# Patient Record
Sex: Female | Born: 1985 | Race: White | Hispanic: No | Marital: Single | Smoking: Never smoker
Health system: Southern US, Community
[De-identification: ages and names within clinical notes are randomized; demographics above are authoritative.]

## PROBLEM LIST (undated history)

## (undated) DIAGNOSIS — F419 Anxiety disorder, unspecified: Secondary | ICD-10-CM

## (undated) DIAGNOSIS — F329 Major depressive disorder, single episode, unspecified: Secondary | ICD-10-CM

## (undated) DIAGNOSIS — F32A Depression, unspecified: Secondary | ICD-10-CM

## (undated) HISTORY — PX: MOUTH SURGERY: SHX715

## (undated) HISTORY — PX: FRACTURE SURGERY: SHX138

---
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
## 2009-03-05 ENCOUNTER — Encounter: Payer: Self-pay | Admitting: Internal Medicine

## 2009-04-04 ENCOUNTER — Ambulatory Visit: Payer: Self-pay | Admitting: Internal Medicine

## 2009-04-04 DIAGNOSIS — J309 Allergic rhinitis, unspecified: Secondary | ICD-10-CM | POA: Insufficient documentation

## 2009-04-04 DIAGNOSIS — R062 Wheezing: Secondary | ICD-10-CM

## 2009-04-04 DIAGNOSIS — K219 Gastro-esophageal reflux disease without esophagitis: Secondary | ICD-10-CM

## 2009-05-22 ENCOUNTER — Ambulatory Visit (HOSPITAL_COMMUNITY): Admission: RE | Admit: 2009-05-22 | Discharge: 2009-05-22 | Payer: Self-pay | Admitting: Internal Medicine

## 2009-05-22 ENCOUNTER — Encounter: Payer: Self-pay | Admitting: Internal Medicine

## 2009-05-29 ENCOUNTER — Ambulatory Visit: Payer: Self-pay | Admitting: Internal Medicine

## 2010-02-12 NOTE — Assessment & Plan Note (Signed)
Summary: CHRONIC ASTHMA SYMPTOMS/APC   Visit Type:  Initial Consult Copy to:  Dr. Reola Calkins Primary Provider/Referring Provider:  Dr. Lillia Pauls student health  CC:  Pulmonary consult for wheezing and SOB with activity..  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 Reports dyspnea and wheezing. Insidious onset one year ago following a cold/URI. REcollects having rash with mucinex and since then current symptoms. Started noticing 'heavy breathing' and wheezing during and after exercise (like jogging or treadmill). Symptoms have been relieved significantly (but not completely) by flovent and pro-air. These inhalers were started around July 2010 by her PMD. Symptoms were worsened during a brief interval when she came off inhalers. She states PMD diagnosed her with asthma but subsequent spirometries(July 2010 and Feb 2011) showed restrictive pattern. Therefore, PMD concerned if she genuinely has asthma or not. Question of GERD by PMD as well per his notes.   She does suffere associated  from GERD (familial). PResent for few years. Controlled with dietary measures and as needed. With dietary indiscretion symptoms can be severe but currently symptoms are mild.   Also reports 70# weight gain since 2007 due to lifestyle changes.  Denies associated cough, sputum, hemoptysis, fever, chills, nausea, vomitting, diarrhea, pregnancy, loc, diplopia, rash, edema, cyanosis.   Current Medications (verified): 1)  Flovent Hfa 44 Mcg/act Aero (Fluticasone Propionate  Hfa) .... 2 Puffs Twice Daily 2)  Proair Hfa 108 (90 Base) Mcg/act Aers (Albuterol Sulfate) .... 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 Has been to [place] and [place] 2007, [place]  2005  Past Medical History: #Allergic Rhinitis > STarted in 2008. Nasal and eyes.. 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 Has been to [place] and [place] 2007, [place]  2005  Review of Systems       The patient complains of shortness of breath with activity.  The patient denies shortness of breath at rest, productive cough, non-productive cough, coughing up blood, chest pain, irregular heartbeats, acid heartburn, indigestion, loss of appetite, weight change, abdominal pain, difficulty swallowing, sore throat, tooth/dental problems, headaches, nasal congestion/difficulty breathing through nose, sneezing, itching, ear ache, anxiety, depression, hand/feet swelling, joint stiffness or pain, rash, change in color of mucus, and fever.    Vital Signs:  Patient profile:   25 year old female Height:      66 inches Weight:      212.44 pounds BMI:     34.41 O2 Sat:      98 % on Room air Temp:     97.8 degrees F oral Pulse rate:   92 / minute BP sitting:   122 / 80  (right arm) Cuff size:   regular  Vitals Entered By: Michel Bickers CMA (April 04, 2009 9:11 AM)  O2 Flow:  Room air CC: Pulmonary consult for wheezing and SOB with activity. Comments Medications reviewed with  patient Carron Curie CMA  April 04, 2009 9:12 AM Daytime phone number verified with patient.       Physical Exam  General:  obese.   Head:  normocephalic and atraumatic Eyes:  PERRLA/EOM intact; conjunctiva and sclera clear Ears:  TMs  intact and clear with normal canals Nose:  no deformity, discharge, inflammation, or lesions Mouth:  no deformity or lesions Neck:  no masses, thyromegaly, or abnormal cervical nodes Chest Wall:  no deformities noted Lungs:  clear bilaterally to auscultation and percussion Heart:  regular rate and rhythm, S1, S2 without murmurs, rubs, gallops, or clicks Abdomen:  bowel sounds positive; abdomen soft and non-tender without masses, or organomegaly Msk:  no deformity or scoliosis noted with normal posture Pulses:  pulses normal Extremities:  no clubbing, cyanosis, edema, or deformity noted Neurologic:  CN II-XII grossly intact with normal reflexes, coordination, muscle strength and tone Skin:  intact without lesions or rashes Cervical Nodes:  no significant adenopathy Axillary Nodes:  no significant adenopathy Psych:  alert and cooperative; normal mood and affect; normal attention span and concentration   MISC. Report  Procedure date:  03/05/2009  Findings:      Outside spirometry: shows classic restriction. Good effort. Good looops. No evidence of VCD. Independently revieewed  Impression & Recommendations:  Problem # 1:  WHEEZING (ICD-786.07) Assessment New hx is very c/w asthma (symptoms + hx of relief with inhalers + family hx). However, spirometry shows restriction which could be explained by obesity even if she has underlying asthma. Best to conclusively determine if she has asthma or not in order to avoid labelling, expensive inhaler use. Therefore. will get methacholine challenge test 3 weeks after she can stay off flovent. She will scheduled this based onher exams. She is concerned about deterioration off flovent. I have advised she could use albuterol as needed til the test. IF worsens, she should come here or go to er for evaluation. Again, explained the importance of gettn methacholine challenge Orders: Pulmonary Referral (Pulmonary) Consultation Level V (16109)  Medications  Added to Medication List This Visit: 1)  Flovent Hfa 44 Mcg/act Aero (Fluticasone propionate  hfa) .... 2 puffs twice daily 2)  Proair Hfa 108 (90 Base) Mcg/act Aers (Albuterol sulfate) .... As needed  Patient Instructions: 1)  please stop flovent for 3 weeks 2)  Then have methacholine challenge test 3)  Return to see me after methacholine challenge test 4)  You cannot be pregnant during this test  ohterwise safe test 5)  If you feel your breathing is worse after holding off flovent, call us and come and see [place] at office   Immunization History:  Influenza Immunization History:    Influenza:  fluvax 3+ (02/13/2009)

## 2010-02-12 NOTE — Assessment & Plan Note (Signed)
Summary: F/U ///KP   Visit Type:  Follow-up Copy to:  Dr. Reola Calkins Primary Provider/Referring Provider:  Dr. Lillia Pauls student health  CC:  Pt here for follow-up to discuss methacoline test. .  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 Reports dyspnea and wheezing. Insidious onset one year ago following a cold/URI. REcollects having rash with mucinex and since then current symptoms. Started noticing 'heavy breathing' and wheezing during and after exercise (like jogging or treadmill). Symptoms have been relieved significantly (but not completely) by flovent and pro-air. These inhalers were started around July 2010 by her PMD. Symptoms were worsened during a brief interval when she came off inhalers. She states PMD diagnosed her with asthma but subsequent spirometries(July 2010 and Feb 2011) showed restrictive pattern. Therefore, PMD concerned if she genuinely has asthma or not. Question of GERD by PMD as well per his notes.   She does suffere associated  from GERD (familial). PResent for few years. Controlled with dietary measures and as needed. With dietary indiscretion symptoms can be severe but currently symptoms are mild.   Also reports 70# weight gain since 2007 due to lifestyle changes.  Denies associated cough, sputum, hemoptysis, fever, chills, nausea, vomitting, diarrhea, pregnancy, loc, diplopia, rash, edema, cyanosis.   REC: STay off flovent x 3 weeks -> MEthacholine challenge testOV 05/29/2009: Followup after methacholine challenge test for asthma symptoms. She styaed off flovent for over 3 weeks and has the test on 05/22/2009. I looked at the test and it is NEGATIVE. She is currently asymptomatic. In re-review of hx: she states only exertion brought on symptoms. SHe is now asymptomatic for months. COming off flovent has not changed anything; still no symptoms. In terms of diet, she is controlling that with diet. nearly asymptomatic from gerd  standpoint too.   Current Medications (verified): 1)  Proair Hfa 108 (90 Base) Mcg/act Aers (Albuterol Sulfate) .... 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 Nasal and eyes.. Mild in spring  #G E R D > controlled with diet #Obese >BMI 34# > 70# weight gain since 2007  Family History: Reviewed history from 04/04/2009 and no changes required. Father-childhood asthma Great Aunt-Breast cancer Denies blood clots  Social History: Reviewed history from 04/04/2009 and no changes required. 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 Has been to [place] and [place] 2007, [place]  2005 Working in [employer] office summer 2011  Review of Systems  The patient denies shortness of breath with activity, shortness of breath at rest, productive cough, non-productive cough, coughing up blood, chest pain, irregular heartbeats, acid heartburn, indigestion, loss of appetite, weight change, abdominal pain, difficulty swallowing, sore throat, tooth/dental problems, headaches, nasal congestion/difficulty breathing through nose, sneezing, itching, ear ache, anxiety, depression, hand/feet swelling, joint stiffness or pain, rash, change in color of mucus, and fever.    Vital Signs:  Patient profile:   25  year old female Height:       66 inches Weight:      209 pounds O2 Sat:      97 % on Room air Temp:     98.5 degrees F oral Pulse rate:   81 / minute BP sitting:   124 / 76  (right arm) Cuff size:   regular  Vitals Entered By: Carron Curie CMA (May 29, 2009 4:25 PM)  O2 Flow:  Room air CC: Pt here for follow-up to discuss methacoline test.  Comments Medications reviewed with patient Carron Curie CMA  May 29, 2009 4:26 PM Daytime phone number verified with patient.    Physical Exam  General:  obese.   Head:  normocephalic and atraumatic Eyes:  PERRLA/EOM intact; conjunctiva and sclera clear Ears:  TMs intact and clear with normal canals Nose:  no deformity, discharge, inflammation, or lesions Mouth:  no deformity or lesions Neck:  no masses, thyromegaly, or abnormal cervical nodes Chest Wall:  no deformities noted Lungs:  clear bilaterally to auscultation and percussion Heart:  regular rate and rhythm, S1, S2 without murmurs, rubs, gallops, or clicks Abdomen:  bowel sounds positive; abdomen soft and non-tender without masses, or organomegaly Msk:  no deformity or scoliosis noted with normal posture Pulses:  pulses normal Extremities:  no clubbing, cyanosis, edema, or deformity noted Neurologic:  CN II-XII grossly intact with normal reflexes, coordination, muscle strength and tone Skin:  intact without lesions or rashes Cervical Nodes:  no significant adenopathy Axillary Nodes:  no significant adenopathy Psych:  alert and cooperative; normal mood and affect; normal attention span and concentration   MISC. Report  Procedure date:  05/22/2009  Findings:       She styaed off flovent for over 3 weeks and has the test on 05/22/2009. I looked at the test and it is NEGATIVE  Impression & Recommendations:  Problem # 1:  WHEEZING (ICD-786.07) Assessment Improved  asympotomtaic and methacholine challenge is negative for asthma. In retrospect sumptoms were probably due to post viral reactive  cough. We agreed to clinically followup in 1 year or sooner if worse. If recurs, will need EIB challenge for asthma  Orders: Est. Patient Level III (62130)  Problem # 2:  G E R D (ICD-530.81) Assessment: Improved  nearly quiescent with dietary changes  Orders: Est. Patient Level III ([id number])  Patient Instructions: 1)  return in 1 year to report progress 2)  return sooner if there are problems

## 2010-02-12 NOTE — Letter (Signed)
Summary: [facility]   Imported By: Sherian Rein 04/12/2009 07:29:27  _____________________________________________________________________  External Attachment:    Type:   Image     Comment:   External Document

## 2010-07-16 ENCOUNTER — Other Ambulatory Visit: Payer: Self-pay | Admitting: Nurse Practitioner

## 2010-07-16 ENCOUNTER — Other Ambulatory Visit (HOSPITAL_COMMUNITY)
Admission: RE | Admit: 2010-07-16 | Discharge: 2010-07-16 | Disposition: A | Discharge status: Home / Self Care | Payer: BC Managed Care – PPO | Source: Ambulatory Visit | Attending: Obstetrics and Gynecology | Admitting: Obstetrics and Gynecology

## 2010-07-16 DIAGNOSIS — Z01419 Encounter for gynecological examination (general) (routine) without abnormal findings: Secondary | ICD-10-CM | POA: Insufficient documentation

## 2012-08-05 ENCOUNTER — Other Ambulatory Visit: Payer: Self-pay | Admitting: Nurse Practitioner

## 2012-08-05 ENCOUNTER — Other Ambulatory Visit (HOSPITAL_COMMUNITY)
Admission: RE | Admit: 2012-08-05 | Discharge: 2012-08-05 | Disposition: A | Discharge status: Home / Self Care | Payer: BC Managed Care – PPO | Source: Ambulatory Visit | Attending: Obstetrics and Gynecology | Admitting: Obstetrics and Gynecology

## 2012-08-05 DIAGNOSIS — Z01419 Encounter for gynecological examination (general) (routine) without abnormal findings: Secondary | ICD-10-CM | POA: Insufficient documentation

## 2015-04-26 ENCOUNTER — Other Ambulatory Visit (HOSPITAL_COMMUNITY)
Admission: RE | Admit: 2015-04-26 | Discharge: 2015-04-26 | Disposition: A | Discharge status: Home / Self Care | Payer: 59 | Source: Ambulatory Visit | Attending: Nurse Practitioner | Admitting: Nurse Practitioner

## 2015-04-26 ENCOUNTER — Other Ambulatory Visit: Payer: Self-pay | Admitting: Nurse Practitioner

## 2015-04-26 DIAGNOSIS — R87810 Cervical high risk human papillomavirus (HPV) DNA test positive: Secondary | ICD-10-CM | POA: Insufficient documentation

## 2015-04-26 DIAGNOSIS — Z1151 Encounter for screening for human papillomavirus (HPV): Secondary | ICD-10-CM | POA: Diagnosis not present

## 2015-04-26 DIAGNOSIS — Z01411 Encounter for gynecological examination (general) (routine) with abnormal findings: Secondary | ICD-10-CM | POA: Insufficient documentation

## 2015-04-30 LAB — CYTOLOGY - PAP

## 2016-04-29 ENCOUNTER — Other Ambulatory Visit (HOSPITAL_COMMUNITY)
Admission: RE | Admit: 2016-04-29 | Discharge: 2016-04-29 | Disposition: A | Discharge status: Home / Self Care | Payer: 59 | Source: Ambulatory Visit | Attending: Nurse Practitioner | Admitting: Nurse Practitioner

## 2016-04-29 ENCOUNTER — Other Ambulatory Visit: Payer: Self-pay | Admitting: Nurse Practitioner

## 2016-04-29 DIAGNOSIS — Z1151 Encounter for screening for human papillomavirus (HPV): Secondary | ICD-10-CM | POA: Insufficient documentation

## 2016-04-29 DIAGNOSIS — Z01419 Encounter for gynecological examination (general) (routine) without abnormal findings: Secondary | ICD-10-CM | POA: Insufficient documentation

## 2016-04-30 LAB — CYTOLOGY - PAP
Diagnosis: NEGATIVE
HPV: NOT DETECTED

## 2018-06-15 ENCOUNTER — Other Ambulatory Visit: Payer: Self-pay | Admitting: Nurse Practitioner

## 2018-06-15 ENCOUNTER — Other Ambulatory Visit (HOSPITAL_COMMUNITY)
Admission: RE | Admit: 2018-06-15 | Discharge: 2018-06-15 | Disposition: A | Discharge status: Home / Self Care | Payer: 59 | Source: Ambulatory Visit | Attending: Nurse Practitioner | Admitting: Nurse Practitioner

## 2018-06-15 DIAGNOSIS — Z124 Encounter for screening for malignant neoplasm of cervix: Secondary | ICD-10-CM | POA: Insufficient documentation

## 2018-06-22 LAB — CYTOLOGY - PAP
Chlamydia: NEGATIVE
Diagnosis: NEGATIVE
HPV: NOT DETECTED
Neisseria Gonorrhea: NEGATIVE

## 2019-03-23 ENCOUNTER — Encounter (HOSPITAL_COMMUNITY): Payer: Self-pay | Admitting: Obstetrics & Gynecology

## 2019-03-23 ENCOUNTER — Inpatient Hospital Stay (HOSPITAL_COMMUNITY)
Admission: AD | Admit: 2019-03-23 | Discharge: 2019-03-23 | Disposition: A | Discharge status: Home / Self Care | Payer: 59 | Attending: Obstetrics & Gynecology | Admitting: Obstetrics & Gynecology

## 2019-03-23 ENCOUNTER — Other Ambulatory Visit: Payer: Self-pay

## 2019-03-23 ENCOUNTER — Inpatient Hospital Stay (HOSPITAL_COMMUNITY): Payer: 59

## 2019-03-23 DIAGNOSIS — O26851 Spotting complicating pregnancy, first trimester: Secondary | ICD-10-CM | POA: Diagnosis not present

## 2019-03-23 DIAGNOSIS — Z3A01 Less than 8 weeks gestation of pregnancy: Secondary | ICD-10-CM | POA: Diagnosis not present

## 2019-03-23 DIAGNOSIS — Z87891 Personal history of nicotine dependence: Secondary | ICD-10-CM | POA: Diagnosis not present

## 2019-03-23 DIAGNOSIS — O209 Hemorrhage in early pregnancy, unspecified: Secondary | ICD-10-CM | POA: Insufficient documentation

## 2019-03-23 DIAGNOSIS — Z3491 Encounter for supervision of normal pregnancy, unspecified, first trimester: Secondary | ICD-10-CM

## 2019-03-23 HISTORY — DX: Anxiety disorder, unspecified: F41.9

## 2019-03-23 HISTORY — DX: Depression, unspecified: F32.A

## 2019-03-23 LAB — URINALYSIS, ROUTINE W REFLEX MICROSCOPIC
Bilirubin Urine: NEGATIVE
Glucose, UA: NEGATIVE mg/dL
Hgb urine dipstick: NEGATIVE
Ketones, ur: NEGATIVE mg/dL
Leukocytes,Ua: NEGATIVE
Nitrite: NEGATIVE
Protein, ur: NEGATIVE mg/dL
Specific Gravity, Urine: 1.016 (ref 1.005–1.030)
pH: 5 (ref 5.0–8.0)

## 2019-03-23 LAB — POCT PREGNANCY, URINE: Preg Test, Ur: POSITIVE — AB

## 2019-03-23 LAB — CBC
HCT: 40 % (ref 36.0–46.0)
Hemoglobin: 13.2 g/dL (ref 12.0–15.0)
MCH: 28.9 pg (ref 26.0–34.0)
MCHC: 33 g/dL (ref 30.0–36.0)
MCV: 87.7 fL (ref 80.0–100.0)
Platelets: 327 10*3/uL (ref 150–400)
RBC: 4.56 MIL/uL (ref 3.87–5.11)
RDW: 13.2 % (ref 11.5–15.5)
WBC: 8.9 10*3/uL (ref 4.0–10.5)
nRBC: 0 % (ref 0.0–0.2)

## 2019-03-23 LAB — WET PREP, GENITAL
Clue Cells Wet Prep HPF POC: NONE SEEN
Sperm: NONE SEEN
Trich, Wet Prep: NONE SEEN
Yeast Wet Prep HPF POC: NONE SEEN

## 2019-03-23 LAB — HCG, QUANTITATIVE, PREGNANCY: hCG, Beta Chain, Quant, S: 17742 m[IU]/mL — ABNORMAL HIGH (ref <5)

## 2019-03-23 LAB — OB RESULTS CONSOLE GC/CHLAMYDIA: Gonorrhea: NEGATIVE

## 2019-03-23 LAB — ABO/RH: ABO/RH(D): A POS

## 2019-03-23 LAB — HIV ANTIBODY (ROUTINE TESTING W REFLEX): HIV Screen 4th Generation wRfx: NONREACTIVE

## 2019-03-23 NOTE — Discharge Instructions (Signed)
Vaginal Bleeding During Pregnancy, First Trimester  A small amount of bleeding from the vagina (spotting) is relatively common during early pregnancy. It usually stops on its own. Various things may cause bleeding or spotting during early pregnancy. Some bleeding may be related to the pregnancy, and some may not. In many cases, the bleeding is normal and is not a problem. However, bleeding can also be a sign of something serious. Be sure to tell your health care provider about any vaginal bleeding right away. Some possible causes of vaginal bleeding during the first trimester include:  Infection or inflammation of the cervix.  Growths (polyps) on the cervix.  Miscarriage or threatened miscarriage.  Pregnancy tissue developing outside of the uterus (ectopic pregnancy).  A mass of tissue developing in the uterus due to an egg being fertilized incorrectly (molar pregnancy). Follow these instructions at home: Activity  Follow instructions from your health care provider about limiting your activity. Ask what activities are safe for you.  If needed, make plans for someone to help with your regular activities.  Do not have sex or orgasms until your health care provider says that this is safe. General instructions  Take over-the-counter and prescription medicines only as told by your health care provider.  Pay attention to any changes in your symptoms.  Do not use tampons or douche.  Write down how many pads you use each day, how often you change pads, and how soaked (saturated) they are.  If you pass any tissue from your vagina, save the tissue so you can show it to your health care provider.  Keep all follow-up visits as told by your health care provider. This is important. Contact a health care provider if:  You have vaginal bleeding during any part of your pregnancy.  You have cramps or labor pains.  You have a fever. Get help right away if:  You have severe cramps in your  back or abdomen.  You pass large clots or a large amount of tissue from your vagina.  Your bleeding increases.  You feel light-headed or weak, or you faint.  You have chills.  You are leaking fluid or have a gush of fluid from your vagina. Summary  A small amount of bleeding (spotting) from the vagina is relatively common during early pregnancy.  Various things may cause bleeding or spotting in early pregnancy.  Be sure to tell your health care provider about any vaginal bleeding right away. This information is not intended to replace advice given to you by your health care provider. Make sure you discuss any questions you have with your health care provider. Document Revised: 04/20/2018 Document Reviewed: 04/03/2016 Elsevier Patient Education  2020 [employer]  

## 2019-03-23 NOTE — MAU Provider Note (Signed)
Chief Complaint: Vaginal Bleeding   First Provider Initiated Contact with Patient 03/23/19 1227     SUBJECTIVE HPI: Sydney Gonzalez is a 34 y.o. G2P0010 at [redacted]w[redacted]d who presents to Maternity Admissions reporting:  Vaginal Bleeding: vaginal bleeding as heavy as a period since this morning that has decreased.  Passage of tissue or clots: Denies Dizziness: Denies Abd Pain: Denies Associated signs and symptoms: Neg for fever, chills, urinary complaints, GI complaints,   Blood Type A pos  Past Medical History:  Diagnosis Date  . Anxiety   . Depression    Situational   OB History  Gravida Para Term Preterm AB Living  2       1 0  SAB TAB Ectopic Multiple Live Births  1            # Outcome Date GA Lbr Len/2nd Weight Sex Delivery Anes PTL Lv  2 Current           1 SAB            Past Surgical History:  Procedure Laterality Date  . FRACTURE SURGERY     Right leg  . MOUTH SURGERY     Social History   Socioeconomic History  . Marital status: Single    Spouse name: Not on file  . Number of children: Not on file  . Years of education: Not on file  . Highest education level: Not on file  Occupational History  . Not on file  Tobacco Use  . Smoking status: Former Smoker    Types: Cigarettes  . Smokeless tobacco: Never Used  Substance and Sexual Activity  . Alcohol use: Not Currently    Comment: Social  . Drug use: Not Currently    Types: Marijuana  . Sexual activity: Yes  Other Topics Concern  . Not on file  Social History Narrative  . Not on file   Social Determinants of Health   Financial Resource Strain:   . Difficulty of Paying Living Expenses: Not on file  Food Insecurity:   . Worried About Programme researcher, broadcasting/film/video in the Last Year: Not on file  . Ran Out of Food in the Last Year: Not on file  Transportation Needs:   . Lack of Transportation (Medical): Not on file  . Lack of Transportation (Non-Medical): Not on file  Physical Activity:   . Days of Exercise per  Week: Not on file  . Minutes of Exercise per Session: Not on file  Stress:   . Feeling of Stress : Not on file  Social Connections:   . Frequency of Communication with Friends and Family: Not on file  . Frequency of Social Gatherings with Friends and Family: Not on file  . Attends Religious Services: Not on file  . Active Member of Clubs or Organizations: Not on file  . Attends Banker Meetings: Not on file  . Marital Status: Not on file  Intimate Partner Violence:   . Fear of Current or Ex-Partner: Not on file  . Emotionally Abused: Not on file  . Physically Abused: Not on file  . Sexually Abused: Not on file   No current facility-administered medications on file prior to encounter.   Current Outpatient Medications on File Prior to Encounter  Medication Sig Dispense Refill  . Prenatal Vit-Fe Fumarate-FA (MULTIVITAMIN-PRENATAL) 27-0.8 MG TABS tablet Take 1 tablet by mouth daily at 12 noon.     No Known Allergies  I have reviewed the past Medical Hx, Surgical Hx,  Social Hx, Allergies and Medications.   Review of Systems  Constitutional: Negative for chills and fever.  Gastrointestinal: Negative for abdominal pain, constipation, diarrhea, nausea and vomiting.  Genitourinary: Positive for vaginal bleeding. Negative for dysuria, flank pain, frequency, hematuria, pelvic pain and vaginal discharge.  Musculoskeletal: Negative for back pain.  Neurological: Negative for dizziness.    OBJECTIVE Patient Vitals for the past 24 hrs:  BP Temp Temp src Pulse Resp SpO2 Height Weight  03/23/19 1418 102/77 98.7 F (37.1 C) Oral 90 18 100 % -- --  03/23/19 1129 124/74 98.6 F (37 C) Oral (!) 109 16 98 % -- --  03/23/19 1125 -- -- -- -- -- -- 5\' 5"  (1.651 m) 103.9 kg   Constitutional: Well-developed, well-nourished female in no acute distress.  Cardiovascular: normal rate Respiratory: normal rate and effort.  GI: Abd soft, non-tender.  MS: Extremities nontender, no edema,  normal ROM Neurologic: Alert and oriented x 4.  GU:  SPECULUM EXAM: NEFG, physiologic discharge, small amount of blood noted, cervix clean, nml ectropion, slightly friable  BIMANUAL: cervix closed; uterus top-nml size, no adnexal tenderness or masses. No CMT.  LAB RESULTS Results for orders placed or performed during the hospital encounter of 03/23/19 (from the past 24 hour(s))  Pregnancy, urine POC     Status: Abnormal   Collection Time: 03/23/19 11:19 AM  Result Value Ref Range   Preg Test, Ur POSITIVE (A) NEGATIVE  Urinalysis, Routine w reflex microscopic     Status: None   Collection Time: 03/23/19 11:22 AM  Result Value Ref Range   Color, Urine YELLOW YELLOW   APPearance CLEAR CLEAR   Specific Gravity, Urine 1.016 1.005 - 1.030   pH 5.0 5.0 - 8.0   Glucose, UA NEGATIVE NEGATIVE mg/dL   Hgb urine dipstick NEGATIVE NEGATIVE   Bilirubin Urine NEGATIVE NEGATIVE   Ketones, ur NEGATIVE NEGATIVE mg/dL   Protein, ur NEGATIVE NEGATIVE mg/dL   Nitrite NEGATIVE NEGATIVE   Leukocytes,Ua NEGATIVE NEGATIVE  hCG, quantitative, pregnancy     Status: Abnormal   Collection Time: 03/23/19 12:41 PM  Result Value Ref Range   hCG, Beta Chain, Quant, S 17,742 (H) <5 mIU/mL  CBC     Status: None   Collection Time: 03/23/19 12:41 PM  Result Value Ref Range   WBC 8.9 4.0 - 10.5 K/uL   RBC 4.56 3.87 - 5.11 MIL/uL   Hemoglobin 13.2 12.0 - 15.0 g/dL   HCT 05/23/19 30.0 - 76.2 %   MCV 87.7 80.0 - 100.0 fL   MCH 28.9 26.0 - 34.0 pg   MCHC 33.0 30.0 - 36.0 g/dL   RDW 26.3 33.5 - 45.6 %   Platelets 327 150 - 400 K/uL   nRBC 0.0 0.0 - 0.2 %  ABO/Rh     Status: None   Collection Time: 03/23/19 12:41 PM  Result Value Ref Range   ABO/RH(D) A POS    No rh immune globuloin      NOT A RH IMMUNE GLOBULIN CANDIDATE, PT RH POSITIVE Performed at [facility], [address], [place], [place]   HIV Antibody (routine testing w rflx)     Status: None   Collection Time: 03/23/19 12:41 PM  Result  Value Ref Range   HIV Screen 4th Generation wRfx NON REACTIVE NON REACTIVE  Wet prep, genital     Status: Abnormal   Collection Time: 03/23/19  1:31 PM   Specimen: PATH Cytology Cervicovaginal Ancillary Only  Result Value  Ref Range   Yeast Wet Prep HPF POC NONE SEEN NONE SEEN   Trich, Wet Prep NONE SEEN NONE SEEN   Clue Cells Wet Prep HPF POC NONE SEEN NONE SEEN   WBC, Wet Prep HPF POC MANY (A) NONE SEEN   Sperm NONE SEEN     IMAGING US OB LESS THAN 14 WEEKS WITH OB TRANSVAGINAL  Result Date: 03/23/2019 CLINICAL DATA:  Vaginal bleeding EXAM: OBSTETRIC <14 WK [place] AND TRANSVAGINAL OB US TECHNIQUE: Both transabdominal and transvaginal ultrasound examinations were performed for complete evaluation of the gestation as well as the maternal uterus, adnexal regions, and pelvic cul-de-sac. Transvaginal technique was performed to assess early pregnancy. COMPARISON:  None. FINDINGS: Intrauterine gestational sac: Visualized Yolk sac:  Visualized Embryo:  Not visualized Cardiac Activity: Not visualized MSD: 9 mm   5 w   4 d Subchorionic hemorrhage:  None visualized. Maternal uterus/adnexae: Cervical os closed. Right ovary measures 4.2 x 2.9 x 2.8 cm. Left ovary measures 2.1 x 3.6 x 2.1 cm. Small corpus luteum on the right. Beyond physiologic corpus luteum, no extrauterine pelvic or adnexal mass. No free pelvic fluid. IMPRESSION: Within the uterus, there is a gestational sac containing a yolk sac. Fetal pole not seen currently. Advise follow-up study in 10-14 days to assess for fetal pole and fetal heart activity. No subchorionic hemorrhage. No extrauterine mass beyond physiologic corpus luteum on the right. No free pelvic fluid. Electronically Signed   By: Lowella Grip III M.D.   On: 03/23/2019 13:03    MAU COURSE CBC, Quant, ABO/Rh, ultrasound, wet prep and GC/chlamydia culture, UA  MDM Bleeding in early pregnancy with normal intrauterine pregnancy and hemodynamically stable. Viability not  confirmed  ASSESSMENT 1. Normal IUP (intrauterine pregnancy) on prenatal ultrasound, first trimester   2. Vaginal bleeding in pregnancy, first trimester     PLAN Discharge home in stable condition. Bleeding precautions Pelvic rest x 1 week Follow-up Information    Gynecology, [place] Follow up in 1 week(s).   Specialty: Obstetrics and Gynecology Why: as scheduled or sooner as needed if symptoms worsen Contact information: [address] [place] [address] [phone]        [facility] Follow up.   Specialty: Obstetrics and Gynecology Why: as needed in pregnancy emergencies Contact information: [address] [id number] [place] [phone]         Allergies as of 03/23/2019   No Known Allergies     Medication List    TAKE these medications   multivitamin-prenatal 27-0.8 MG Tabs tablet Take 1 tablet by mouth daily at 12 noon.        Tamala Julian, [place], [place] 03/23/2019  3:33 PM  4

## 2019-03-23 NOTE — MAU Note (Signed)
Sydney Gonzalez is a 34 y.o. at [redacted]w[redacted]d here in MAU reporting: woke up this AM with bleeding, states it was more than spotting and was similar to a light period. Is not wearing a pad but did notice bleeding when she wiped when using the bathroom in MAU. Maybe had some cramping this morning but none at this time. Last IC was yesterday  LMP: 02/09/19  Onset of complaint: today  Pain score: 0/10  Vitals:   03/23/19 1129  BP: 124/74  Pulse: (!) 109  Resp: 16  Temp: 98.6 F (37 C)  SpO2: 98%     Lab orders placed from triage: UA, UPT

## 2019-03-24 LAB — GC/CHLAMYDIA PROBE AMP (~~LOC~~) NOT AT ARMC
Chlamydia: NEGATIVE
Comment: NEGATIVE
Comment: NORMAL
Neisseria Gonorrhea: NEGATIVE

## 2019-04-16 ENCOUNTER — Other Ambulatory Visit: Payer: Self-pay

## 2019-04-16 ENCOUNTER — Encounter (HOSPITAL_COMMUNITY): Payer: Self-pay | Admitting: Obstetrics and Gynecology

## 2019-04-16 ENCOUNTER — Inpatient Hospital Stay (HOSPITAL_COMMUNITY)
Admission: AD | Admit: 2019-04-16 | Discharge: 2019-04-16 | Disposition: A | Discharge status: Home / Self Care | Payer: 59 | Attending: Obstetrics and Gynecology | Admitting: Obstetrics and Gynecology

## 2019-04-16 DIAGNOSIS — R200 Anesthesia of skin: Secondary | ICD-10-CM | POA: Insufficient documentation

## 2019-04-16 DIAGNOSIS — O99891 Other specified diseases and conditions complicating pregnancy: Secondary | ICD-10-CM | POA: Diagnosis not present

## 2019-04-16 DIAGNOSIS — Z87891 Personal history of nicotine dependence: Secondary | ICD-10-CM | POA: Diagnosis not present

## 2019-04-16 DIAGNOSIS — O219 Vomiting of pregnancy, unspecified: Secondary | ICD-10-CM | POA: Insufficient documentation

## 2019-04-16 DIAGNOSIS — Z3A09 9 weeks gestation of pregnancy: Secondary | ICD-10-CM | POA: Diagnosis not present

## 2019-04-16 DIAGNOSIS — H538 Other visual disturbances: Secondary | ICD-10-CM | POA: Insufficient documentation

## 2019-04-16 DIAGNOSIS — O26891 Other specified pregnancy related conditions, first trimester: Secondary | ICD-10-CM | POA: Insufficient documentation

## 2019-04-16 DIAGNOSIS — R202 Paresthesia of skin: Secondary | ICD-10-CM | POA: Insufficient documentation

## 2019-04-16 LAB — CBC
HCT: 41.3 % (ref 36.0–46.0)
Hemoglobin: 13.6 g/dL (ref 12.0–15.0)
MCH: 28.8 pg (ref 26.0–34.0)
MCHC: 32.9 g/dL (ref 30.0–36.0)
MCV: 87.3 fL (ref 80.0–100.0)
Platelets: 354 10*3/uL (ref 150–400)
RBC: 4.73 MIL/uL (ref 3.87–5.11)
RDW: 12.8 % (ref 11.5–15.5)
WBC: 10.9 10*3/uL — ABNORMAL HIGH (ref 4.0–10.5)
nRBC: 0 % (ref 0.0–0.2)

## 2019-04-16 LAB — COMPREHENSIVE METABOLIC PANEL
ALT: 27 U/L (ref 0–44)
AST: 21 U/L (ref 15–41)
Albumin: 3.5 g/dL (ref 3.5–5.0)
Alkaline Phosphatase: 40 U/L (ref 38–126)
Anion gap: 10 (ref 5–15)
BUN: 6 mg/dL (ref 6–20)
CO2: 25 mmol/L (ref 22–32)
Calcium: 9.5 mg/dL (ref 8.9–10.3)
Chloride: 102 mmol/L (ref 98–111)
Creatinine, Ser: 0.74 mg/dL (ref 0.44–1.00)
GFR calc Af Amer: >60 mL/min (ref >60)
GFR calc non Af Amer: >60 mL/min (ref >60)
Glucose, Bld: 89 mg/dL (ref 70–99)
Potassium: 4.1 mmol/L (ref 3.5–5.1)
Sodium: 137 mmol/L (ref 135–145)
Total Bilirubin: 0.3 mg/dL (ref 0.3–1.2)
Total Protein: 7 g/dL (ref 6.5–8.1)

## 2019-04-16 LAB — URINALYSIS, ROUTINE W REFLEX MICROSCOPIC
Bilirubin Urine: NEGATIVE
Glucose, UA: NEGATIVE mg/dL
Ketones, ur: NEGATIVE mg/dL
Leukocytes,Ua: NEGATIVE
Nitrite: NEGATIVE
Protein, ur: NEGATIVE mg/dL
Specific Gravity, Urine: 1.004 — ABNORMAL LOW (ref 1.005–1.030)
pH: 6 (ref 5.0–8.0)

## 2019-04-16 LAB — GLUCOSE, CAPILLARY: Glucose-Capillary: 74 mg/dL (ref 70–99)

## 2019-04-16 MED ORDER — DOXYLAMINE-PYRIDOXINE 10-10 MG PO TBEC: 1.0000 | DELAYED_RELEASE_TABLET | Freq: Three times a day (TID) | ORAL | 3 refills | Status: DC | PRN

## 2019-04-16 NOTE — MAU Provider Note (Signed)
Chief Complaint: Headache   First Provider Initiated Contact with Patient 04/16/19 1336     SUBJECTIVE HPI: Sydney Gonzalez is a 34 y.o. G2P0010 at [redacted]w[redacted]d who presents to Maternity Admissions reporting episode of blurry vision, mild headache and numbness in left hand this morning after shower that have all resolved spontaneously except for mild headache.  Numbness started in fourth and fifth digits of left hand, traveled to inner aspect of forearm, up to thumb and went away in less than a minute. No history of similar symptoms.  Ate plain pasta and milk for breakfast.   Also reports significant nausea and occasional vomiting throughout pregnancy.  Has not tried anything for the symptoms.  Makes it difficult for her to eat and drink well, especially in the morning.  Modifying factors: Resolved spontaneously. Associated signs and symptoms: Negative for fever, chills, mental status change, difficulties with speech or gait, weakness, nausea, vomiting, ongoing numbness.  Still having scant vaginal spotting.   Fetal viability confirmed by ultrasound at last office visit at [facility] per patient.  Past Medical History:  Diagnosis Date  . Anxiety   . Depression    Situational   OB History  Gravida Para Term Preterm AB Living  2       1 0  SAB TAB Ectopic Multiple Live Births  1            # Outcome Date GA Lbr Len/2nd Weight Sex Delivery Anes PTL Lv  2 Current           1 SAB            Past Surgical History:  Procedure Laterality Date  . FRACTURE SURGERY     Right leg  . MOUTH SURGERY     Social History   Socioeconomic History  . Marital status: Single    Spouse name: Not on file  . Number of children: Not on file  . Years of education: Not on file  . Highest education level: Not on file  Occupational History  . Not on file  Tobacco Use  . Smoking status: Former Smoker    Types: Cigarettes  . Smokeless tobacco: Never Used  Substance and Sexual Activity  . Alcohol use: Not  Currently    Comment: Social  . Drug use: Not Currently    Types: Marijuana  . Sexual activity: Yes  Other Topics Concern  . Not on file  Social History Narrative  . Not on file   Social Determinants of Health   Financial Resource Strain:   . Difficulty of Paying Living Expenses:   Food Insecurity:   . Worried About Programme researcher, broadcasting/film/video in the Last Year:   . Barista in the Last Year:   Transportation Needs:   . Freight forwarder (Medical):   Marland Kitchen Lack of Transportation (Non-Medical):   Physical Activity:   . Days of Exercise per Week:   . Minutes of Exercise per Session:   Stress:   . Feeling of Stress :   Social Connections:   . Frequency of Communication with Friends and Family:   . Frequency of Social Gatherings with Friends and Family:   . Attends Religious Services:   . Active Member of Clubs or Organizations:   . Attends Banker Meetings:   Marland Kitchen Marital Status:   Intimate Partner Violence:   . Fear of Current or Ex-Partner:   . Emotionally Abused:   Marland Kitchen Physically Abused:   . Sexually  Abused:    Family History  Problem Relation Age of Onset  . Cancer Mother        Ovarian  . Stroke Mother        Ocular  . Heart defect Father    No current facility-administered medications on file prior to encounter.   Current Outpatient Medications on File Prior to Encounter  Medication Sig Dispense Refill  . Prenatal Vit-Fe Fumarate-FA (MULTIVITAMIN-PRENATAL) 27-0.8 MG TABS tablet Take 1 tablet by mouth daily at 12 noon.     No Known Allergies  I have reviewed patient's Past Medical Hx, Surgical Hx, Family Hx, Social Hx, medications and allergies.   Review of Systems  Constitutional: Negative for chills and fever.  Eyes: Positive for visual disturbance. Negative for photophobia.  Gastrointestinal: Negative for abdominal pain, nausea and vomiting.  Genitourinary: Positive for vaginal bleeding.  Neurological: Positive for numbness and headaches.  Negative for dizziness, tremors, seizures, syncope, facial asymmetry, speech difficulty and weakness.  Psychiatric/Behavioral: Negative for confusion.    OBJECTIVE Patient Vitals for the past 24 hrs: Orthostatic vital signs BP Temp Temp src Pulse Resp SpO2  04/16/19 1422 115/71 - - 100 - -  04/16/19 1419 113/73 - - (!) 101 - -  04/16/19 1417 110/71 - - 93 - -  04/16/19 1416 121/77 - - 88 - -          04/16/19 1327 118/89 98.1 F (36.7 C) Oral (!) 101 18 99 %    Constitutional: Well-developed, well-nourished female in no acute distress.  Skin: No pallor Cardiovascular: normal rate Respiratory: normal rate and effort.  GI: Abd soft, non-tender, fundus nonpalpable MS: Extremities nontender, no edema, normal ROM.  Grossly normal and symmetric strength in upper and lower extremities. Neurologic: Alert and oriented x 4.  Grossly normal sensation in upper and lower extremities. GU: Deferred  Fetal heart rate 182 by informal bedside ultrasound.  LAB RESULTS Results for orders placed or performed during the hospital encounter of 04/16/19 (from the past 24 hour(s))  Glucose, capillary     Status: None   Collection Time: 04/16/19  2:13 PM  Result Value Ref Range   Glucose-Capillary 74 70 - 99 mg/dL  Urinalysis, Routine w reflex microscopic     Status: Abnormal   Collection Time: 04/16/19  2:28 PM  Result Value Ref Range   Color, Urine STRAW (A) YELLOW   APPearance CLEAR CLEAR   Specific Gravity, Urine 1.004 (L) 1.005 - 1.030   pH 6.0 5.0 - 8.0   Glucose, UA NEGATIVE NEGATIVE mg/dL   Hgb urine dipstick SMALL (A) NEGATIVE   Bilirubin Urine NEGATIVE NEGATIVE   Ketones, ur NEGATIVE NEGATIVE mg/dL   Protein, ur NEGATIVE NEGATIVE mg/dL   Nitrite NEGATIVE NEGATIVE   Leukocytes,Ua NEGATIVE NEGATIVE   RBC / HPF 0-5 0 - 5 RBC/hpf   WBC, UA 0-5 0 - 5 WBC/hpf   Bacteria, UA RARE (A) NONE SEEN   Squamous Epithelial / LPF 0-5 0 - 5  CBC     Status: Abnormal   Collection Time: 04/16/19   2:34 PM  Result Value Ref Range   WBC 10.9 (H) 4.0 - 10.5 K/uL   RBC 4.73 3.87 - 5.11 MIL/uL   Hemoglobin 13.6 12.0 - 15.0 g/dL   HCT 77.8 24.2 - 35.3 %   MCV 87.3 80.0 - 100.0 fL   MCH 28.8 26.0 - 34.0 pg   MCHC 32.9 30.0 - 36.0 g/dL   RDW 61.4 43.1 - 54.0 %  Platelets 354 150 - 400 K/uL   nRBC 0.0 0.0 - 0.2 %  Comprehensive metabolic panel     Status: None   Collection Time: 04/16/19  2:34 PM  Result Value Ref Range   Sodium 137 135 - 145 mmol/L   Potassium 4.1 3.5 - 5.1 mmol/L   Chloride 102 98 - 111 mmol/L   CO2 25 22 - 32 mmol/L   Glucose, Bld 89 70 - 99 mg/dL   BUN 6 6 - 20 mg/dL   Creatinine, Ser 1.61 0.44 - 1.00 mg/dL   Calcium 9.5 8.9 - 09.6 mg/dL   Total Protein 7.0 6.5 - 8.1 g/dL   Albumin 3.5 3.5 - 5.0 g/dL   AST 21 15 - 41 U/L   ALT 27 0 - 44 U/L   Alkaline Phosphatase 40 38 - 126 U/L   Total Bilirubin 0.3 0.3 - 1.2 mg/dL   GFR calc non Af Amer >60 >60 mL/min   GFR calc Af Amer >60 >60 mL/min   Anion gap 10 5 - 15    IMAGING NA  MAU COURSE Orders Placed This Encounter  Procedures  . Urinalysis, Routine w reflex microscopic  . CBC  . Comprehensive metabolic panel  . Glucose, capillary  . Orthostatic vital signs  . Encourage fluids  . Discharge patient   Meds ordered this encounter  Medications  . Doxylamine-Pyridoxine 10-10 MG TBEC    Sig: Take 1-2 tablets by mouth 3 (three) times daily as needed (nausea). Start with 2 tablets every evening. If symptoms persist, add 1 tablet every morning. If symptoms persist, add 1 tablet at mid-day.    Dispense:  60 tablet    Refill:  3    Order Specific Question:   Supervising Provider    Answer:   Reva Bores [[id number]]   Orthostatic VS Nml.   Discussed Hx, labs, exam w/ Dr. Rosalia Hammers, ED doctor. Agrees w/ POC.  Head imaging not indicated due to young, otherwise healthy patient without hypertension and spontaneous resolution of symptoms.  Discussed with Dr. Shawnie Pons.  Agrees.  No further Sx in MAU.   MDM -Episode  of blurry vision after shower possibly due to episode of orthostatic hypotension from vasodilation from hot shower. No further Sx.  - Brief episode of tingling in left hand. Possibly positional. Low suspicion for stroke or other emergent neurological condition due to brief, spontaneously resolving nature and absence of other neuro Sx.   ASSESSMENT 1. Blurred vision   2. Numbness and tingling in left hand   3. Nausea/vomiting in pregnancy     PLAN Discharge home in stable condition. Stroke precautions Will Tx N/V of pregnancy to allow pt to improve PO intake and prevent/Tx possible orthostatic hypotension and hypoglycemia  Follow-up Information    Gyne[facility], [facility] Follow up in 3 week(s).   Specialty: Obstetrics and Gynecology Why: as scheduled  Contact information: [address] [place] [address] [phone]        [facility] Follow up.   Specialty: Obstetrics and Gynecology Why: as needed in pregnancy emergencies Contact information: [address] [id number] [place] [address] [phone]       [facility] EMERGENCY DEPARTMENT Follow up.   Specialty: Emergency Medicine Why: as needed if symptoms worsen (Severe headache, worsening numbness, difficulty with speech or walking, confusion) Contact information: [address] [id number] [place] [employer] [place] [address] [phone]  Allergies as of 04/16/2019   No Known Allergies     Medication List    TAKE these medications   Doxylamine-Pyridoxine 10-10 MG Tbec Take 1-2 tablets by mouth 3 (three) times daily as needed (nausea). Start with 2 tablets every evening. If symptoms persist, add 1 tablet every morning. If symptoms persist, add 1 tablet at mid-day.   multivitamin-prenatal 27-0.8 MG Tabs tablet Take 1 tablet by mouth daily at 12 noon.        Tamala Julian, [place], [place] 04/16/2019  3:26  PM

## 2019-04-16 NOTE — Discharge Instructions (Signed)
Orthostatic Hypotension °Blood pressure is a measurement of how strongly, or weakly, your blood is pressing against the walls of your arteries. Orthostatic hypotension is a sudden drop in blood pressure that happens when you quickly change positions, such as when you get up from sitting or lying down. °Arteries are blood vessels that carry blood from your heart throughout your body. When blood pressure is too low, you may not get enough blood to your brain or to the rest of your organs. This can cause weakness, light-headedness, rapid heartbeat, and fainting. This can last for just a few seconds or for up to a few minutes. Orthostatic hypotension is usually not a serious problem. However, if it happens frequently or gets worse, it may be a sign of something more serious. °What are the causes? °This condition may be caused by: °· Sudden changes in posture, such as standing up quickly after you have been sitting or lying down. °· Blood loss. °· Loss of body fluids (dehydration). °· Heart problems. °· Hormone (endocrine) problems. °· Pregnancy. °· Severe infection. °· Lack of certain nutrients. °· Severe allergic reactions (anaphylaxis). °· Certain medicines, such as blood pressure medicine or medicines that make the body lose excess fluids (diuretics). Sometimes, this condition can be caused by not taking medicine as directed, such as taking too much of a certain medicine. °What increases the risk? °The following factors may make you more likely to develop this condition: °· Age. Risk increases as you get older. °· Conditions that affect the heart or the central nervous system. °· Taking certain medicines, such as blood pressure medicine or diuretics. °· Being pregnant. °What are the signs or symptoms? °Symptoms of this condition may include: °· Weakness. °· Light-headedness. °· Dizziness. °· Blurred vision. °· Fatigue. °· Rapid heartbeat. °· Fainting, in severe cases. °How is this diagnosed? °This condition is  diagnosed based on: °· Your medical history. °· Your symptoms. °· Your blood pressure measurement. Your health care provider will check your blood pressure when you are: °? Lying down. °? Sitting. °? Standing. °A blood pressure reading is recorded as two numbers, such as "120 over 80" (or 120/80). The first ("top") number is called the systolic pressure. It is a measure of the pressure in your arteries as your heart beats. The second ("bottom") number is called the diastolic pressure. It is a measure of the pressure in your arteries when your heart relaxes between beats. Blood pressure is measured in a unit called mm Hg. Healthy blood pressure for most adults is 120/80. If your blood pressure is below 90/60, you may be diagnosed with hypotension. °Other information or tests that may be used to diagnose orthostatic hypotension include: °· Your other vital signs, such as your heart rate and temperature. °· Blood tests. °· Tilt table test. For this test, you will be safely secured to a table that moves you from a lying position to an upright position. Your heart rhythm and blood pressure will be monitored during the test. °How is this treated? °This condition may be treated by: °· Changing your diet. This may involve eating more salt (sodium) or drinking more water. °· Taking medicines to raise your blood pressure. °· Changing the dosage of certain medicines you are taking that might be lowering your blood pressure. °· Wearing compression stockings. These stockings help to prevent blood clots and reduce swelling in your legs. °In some cases, you may need to go to the hospital for: °· Fluid replacement. This means you will   receive fluids through an IV.  Blood replacement. This means you will receive donated blood through an IV (transfusion).  Treating an infection or heart problems, if this applies.  Monitoring. You may need to be monitored while medicines that you are taking wear off. Follow these instructions  at home: Eating and drinking   Drink enough fluid to keep your urine pale yellow.  Eat a healthy diet, and follow instructions from your health care provider about eating or drinking restrictions. A healthy diet includes: ? Fresh fruits and vegetables. ? Whole grains. ? Lean meats. ? Low-fat dairy products.  Eat extra salt only as directed. Do not add extra salt to your diet unless your health care provider told you to do that.  Eat frequent, small meals.  Avoid standing up suddenly after eating. Medicines  Take over-the-counter and prescription medicines only as told by your health care provider. ? Follow instructions from your health care provider about changing the dosage of your current medicines, if this applies. ? Do not stop or adjust any of your medicines on your own. General instructions   Wear compression stockings as told by your health care provider.  Get up slowly from lying down or sitting positions. This gives your blood pressure a chance to adjust.  Avoid hot showers and excessive heat as directed by your health care provider.  Return to your normal activities as told by your health care provider. Ask your health care provider what activities are safe for you.  Do not use any products that contain nicotine or tobacco, such as cigarettes, e-cigarettes, and chewing tobacco. If you need help quitting, ask your health care provider.  Keep all follow-up visits as told by your health care provider. This is important. Contact a health care provider if you:  Vomit.  Have diarrhea.  Have a fever for more than 2-3 days.  Feel more thirsty than usual.  Feel weak and tired. Get help right away if you:  Have chest pain.  Have a fast or irregular heartbeat.  Develop numbness in any part of your body.  Cannot move your arms or your legs.  Have trouble speaking.  Become sweaty or feel light-headed.  Faint.  Feel short of breath.  Have trouble staying  awake.  Feel confused. Summary  Orthostatic hypotension is a sudden drop in blood pressure that happens when you quickly change positions.  Orthostatic hypotension is usually not a serious problem.  It is diagnosed by having your blood pressure taken lying down, sitting, and then standing.  It may be treated by changing your diet or adjusting your medicines. This information is not intended to replace advice given to you by your health care provider. Make sure you discuss any questions you have with your health care provider. Document Revised: 06/25/2017 Document Reviewed: 06/25/2017 Elsevier Patient Education  [place].    Hypoglycemia Hypoglycemia occurs when the level of sugar (glucose) in the blood is too low. Hypoglycemia can happen in people who do or do not have diabetes. It can develop quickly, and it can be a medical emergency. For most people with diabetes, a blood glucose level below 70 mg/dL (3.9 mmol/L) is considered hypoglycemia. Glucose is a type of sugar that provides the body's main source of energy. Certain hormones (insulin and glucagon) control the level of glucose in the blood. Insulin lowers blood glucose, and glucagon raises blood glucose. Hypoglycemia can result from having too much insulin in the bloodstream, or from not eating enough  food that contains glucose. You may also have reactive hypoglycemia, which happens within 4 hours after eating a meal. What are the causes? Hypoglycemia occurs most often in people who have diabetes and may be caused by:  Diabetes medicine.  Not eating enough, or not eating often enough.  Increased physical activity.  Drinking alcohol on an empty stomach. If you do not have diabetes, hypoglycemia may be caused by:  A tumor in the pancreas.  Not eating enough, or not eating for long periods at a time (fasting).  A severe infection or illness.  Certain medicines. What increases the risk? Hypoglycemia is more  likely to develop in:  People who have diabetes and take medicines to lower blood glucose.  People who abuse alcohol.  People who have a severe illness. What are the signs or symptoms? Mild symptoms Mild hypoglycemia may not cause any symptoms. If you do have symptoms, they may include:  Hunger.  Anxiety.  Sweating and feeling clammy.  Dizziness or feeling light-headed.  Sleepiness.  Nausea.  Increased heart rate.  Headache.  Blurry vision.  Irritability.  Tingling or numbness around the mouth, lips, or tongue.  A change in coordination.  Restless sleep. Moderate symptoms Moderate hypoglycemia can cause:  Mental confusion and poor judgment.  Behavior changes.  Weakness.  Irregular heartbeat. Severe symptoms Severe hypoglycemia is a medical emergency. It can cause:  Fainting.  Seizures.  Loss of consciousness (coma).  Death. How is this diagnosed? Hypoglycemia is diagnosed with a blood test to measure your blood glucose level. This blood test is done while you are having symptoms. Your health care provider may also do a physical exam and review your medical history. How is this treated? This condition can often be treated by immediately eating or drinking something that contains sugar, such as:  Fruit juice, 4-6 oz (120-150 mL).  Regular soda (not diet soda), 4-6 oz (120-150 mL).  Low-fat milk, 4 oz (120 mL).  Several pieces of hard candy.  Sugar or honey, 1 Tbsp (15 mL). Treating hypoglycemia if you have diabetes If you are alert and able to swallow safely, follow the 15:15 rule:  Take 15 grams of a rapid-acting carbohydrate. Talk with your health care provider about how much you should take.  Rapid-acting options include: ? Glucose pills (take 15 grams). ? 6-8 pieces of hard candy. ? 4-6 oz (120-150 mL) of fruit juice. ? 4-6 oz (120-150 mL) of regular (not diet) soda. ? 1 Tbsp (15 mL) honey or sugar.  Check your blood glucose 15  minutes after you take the carbohydrate.  If the repeat blood glucose level is still at or below 70 mg/dL (3.9 mmol/L), take 15 grams of a carbohydrate again.  If your blood glucose level does not increase above 70 mg/dL (3.9 mmol/L) after 3 tries, seek emergency medical care.  After your blood glucose level returns to normal, eat a meal or a snack within 1 hour.  Treating severe hypoglycemia Severe hypoglycemia is when your blood glucose level is at or below 54 mg/dL (3 mmol/L). Severe hypoglycemia is a medical emergency. Get medical help right away. If you have severe hypoglycemia and you cannot eat or drink, you may need an injection of glucagon. A family member or close friend should learn how to check your blood glucose and how to give you a glucagon injection. Ask your health care provider if you need to have an emergency glucagon injection kit available. Severe hypoglycemia may need to be treated in  a hospital. The treatment may include getting glucose through an IV. You may also need treatment for the cause of your hypoglycemia. Follow these instructions at home:  General instructions  Take over-the-counter and prescription medicines only as told by your health care provider.  Monitor your blood glucose as told by your health care provider.  Limit alcohol intake to no more than 1 drink a day for nonpregnant women and 2 drinks a day for men. One drink equals 12 oz of beer (355 mL), 5 oz of wine (148 mL), or 1 oz of hard liquor (44 mL).  Keep all follow-up visits as told by your health care provider. This is important. If you have diabetes:  Always have a rapid-acting carbohydrate snack with you to treat low blood glucose.  Follow your diabetes management plan as directed. Make sure you: ? Know the symptoms of hypoglycemia. It is important to treat it right away to prevent it from becoming severe. ? Take your medicines as directed. ? Follow your exercise plan. ? Follow your meal  plan. Eat on time, and do not skip meals. ? Check your blood glucose as often as directed. Always check before and after exercise. ? Follow your sick day plan whenever you cannot eat or drink normally. Make this plan in advance with your health care provider.  Share your diabetes management plan with people in your workplace, school, and household.  Check your urine for ketones when you are ill and as told by your health care provider.  Carry a medical alert card or wear medical alert jewelry. Contact a health care provider if:  You have problems keeping your blood glucose in your target range.  You have frequent episodes of hypoglycemia. Get help right away if:  You continue to have hypoglycemia symptoms after eating or drinking something containing glucose.  Your blood glucose is at or below 54 mg/dL (3 mmol/L).  You have a seizure.  You faint. These symptoms may represent a serious problem that is an emergency. Do not wait to see if the symptoms will go away. Get medical help right away. Call your local emergency services (911 in the [place] Summary  Hypoglycemia occurs when the level of sugar (glucose) in the blood is too low.  Hypoglycemia can happen in people who do or do not have diabetes. It can develop quickly, and it can be a medical emergency.  Make sure you know the symptoms of hypoglycemia and how to treat it.  Always have a rapid-acting carbohydrate snack with you to treat low blood sugar. This information is not intended to replace advice given to you by your health care provider. Make sure you discuss any questions you have with your health care provider. Document Revised: 06/22/2017 Document Reviewed: 02/02/2015 Elsevier Patient Education  2020 [employer].

## 2019-04-16 NOTE — MAU Note (Signed)
Sydney Gonzalez is a 34 y.o. at [redacted]w[redacted]d here in MAU reporting: this morning had some blurry vision, around mid morning. States she called the office and they told her to drink some water. Then started with a headache and her left hand went numb. States hand is no longer numb. Also think vision is okay now. Still having light spotting since previous visit.  Onset of complaint: today  Pain score: 2/10  Vitals:   04/16/19 1327  BP: 118/89  Pulse: (!) 101  Resp: 18  Temp: 98.1 F (36.7 C)  SpO2: 99%     Lab orders placed from triage: [facility]

## 2019-08-05 LAB — OB RESULTS CONSOLE HEPATITIS B SURFACE ANTIGEN: Hepatitis B Surface Ag: NEGATIVE

## 2019-08-05 LAB — OB RESULTS CONSOLE RUBELLA ANTIBODY, IGM: Rubella: IMMUNE

## 2019-08-26 LAB — OB RESULTS CONSOLE RPR: RPR: NONREACTIVE

## 2019-09-15 ENCOUNTER — Encounter (HOSPITAL_COMMUNITY): Payer: Self-pay | Admitting: Obstetrics and Gynecology

## 2019-09-15 ENCOUNTER — Other Ambulatory Visit: Payer: Self-pay

## 2019-09-15 ENCOUNTER — Inpatient Hospital Stay (HOSPITAL_BASED_OUTPATIENT_CLINIC_OR_DEPARTMENT_OTHER): Payer: 59

## 2019-09-15 ENCOUNTER — Inpatient Hospital Stay (HOSPITAL_COMMUNITY)
Admission: AD | Admit: 2019-09-15 | Discharge: 2019-09-15 | Disposition: A | Discharge status: Home / Self Care | Payer: 59 | Attending: Obstetrics and Gynecology | Admitting: Obstetrics and Gynecology

## 2019-09-15 DIAGNOSIS — F419 Anxiety disorder, unspecified: Secondary | ICD-10-CM | POA: Diagnosis not present

## 2019-09-15 DIAGNOSIS — Z3A31 31 weeks gestation of pregnancy: Secondary | ICD-10-CM | POA: Insufficient documentation

## 2019-09-15 DIAGNOSIS — O368130 Decreased fetal movements, third trimester, not applicable or unspecified: Secondary | ICD-10-CM | POA: Insufficient documentation

## 2019-09-15 DIAGNOSIS — Z87891 Personal history of nicotine dependence: Secondary | ICD-10-CM | POA: Insufficient documentation

## 2019-09-15 DIAGNOSIS — Z886 Allergy status to analgesic agent status: Secondary | ICD-10-CM | POA: Insufficient documentation

## 2019-09-15 DIAGNOSIS — Z79899 Other long term (current) drug therapy: Secondary | ICD-10-CM | POA: Insufficient documentation

## 2019-09-15 DIAGNOSIS — F329 Major depressive disorder, single episode, unspecified: Secondary | ICD-10-CM | POA: Insufficient documentation

## 2019-09-15 DIAGNOSIS — O99343 Other mental disorders complicating pregnancy, third trimester: Secondary | ICD-10-CM | POA: Diagnosis not present

## 2019-09-15 NOTE — MAU Note (Signed)
.   Sydney Gonzalez is a 34 y.o. at [redacted]w[redacted]d here in MAU reporting: she has had a decrease in fetal movement since yesterday. Denies any pain. Denies any VB or LOF  Onset of complaint: yesterday Pain score: 0 Vitals:   09/15/19 1828  BP: 112/82  Pulse: 90  Resp: 16  Temp: 98.2 F (36.8 C)  SpO2: 97%     FHT:145 Lab orders placed from triage:

## 2019-09-15 NOTE — Discharge Instructions (Signed)
Fetal Movement Counts Patient Name: ________________________________________________ Patient Due Date: ____________________ What is a fetal movement count?  A fetal movement count is the number of times that you feel your baby move during a certain amount of time. This may also be called a fetal kick count. A fetal movement count is recommended for every pregnant woman. You may be asked to start counting fetal movements as early as week 28 of your pregnancy. Pay attention to when your baby is most active. You may notice your baby's sleep and wake cycles. You may also notice things that make your baby move more. You should do a fetal movement count:  When your baby is normally most active.  At the same time each day. A good time to count movements is while you are resting, after having something to eat and drink. How do I count fetal movements? 1. Find a quiet, comfortable area. Sit, or lie down on your side. 2. Write down the date, the start time and stop time, and the number of movements that you felt between those two times. Take this information with you to your health care visits. 3. Write down your start time when you feel the first movement. 4. Count kicks, flutters, swishes, rolls, and jabs. You should feel at least 10 movements. 5. You may stop counting after you have felt 10 movements, or if you have been counting for 2 hours. Write down the stop time. 6. If you do not feel 10 movements in 2 hours, contact your health care provider for further instructions. Your health care provider may want to do additional tests to assess your baby's well-being. Contact a health care provider if:  You feel fewer than 10 movements in 2 hours.  Your baby is not moving like he or she usually does. Date: ____________ Start time: ____________ Stop time: ____________ Movements: ____________ Date: ____________ Start time: ____________ Stop time: ____________ Movements: ____________ Date: ____________  Start time: ____________ Stop time: ____________ Movements: ____________ Date: ____________ Start time: ____________ Stop time: ____________ Movements: ____________ Date: ____________ Start time: ____________ Stop time: ____________ Movements: ____________ Date: ____________ Start time: ____________ Stop time: ____________ Movements: ____________ Date: ____________ Start time: ____________ Stop time: ____________ Movements: ____________ Date: ____________ Start time: ____________ Stop time: ____________ Movements: ____________ Date: ____________ Start time: ____________ Stop time: ____________ Movements: ____________ This information is not intended to replace advice given to you by your health care provider. Make sure you discuss any questions you have with your health care provider. Document Revised: 08/19/2018 Document Reviewed: 08/19/2018 Elsevier Patient Education  2020 [employer] Inc[employer]  

## 2019-09-15 NOTE — MAU Provider Note (Signed)
History   [id number]   Chief Complaint  Patient presents with  . Decreased Fetal Movement    HPI Sydney Gonzalez is a 34 y.o. female  G2P0010 here with report of decreased fetal movement since this morning.  Reports feeling the baby move approximately 1 time in the past 24 hours.  Denies vaginal bleeding or leaking of fluid.  Denies abdominal pain.  Patient's last menstrual period was 02/09/2019.  OB History  Gravida Para Term Preterm AB Living  2       1 0  SAB TAB Ectopic Multiple Live Births  1            # Outcome Date GA Lbr Len/2nd Weight Sex Delivery Anes PTL Lv  2 Current           1 SAB             Past Medical History:  Diagnosis Date  . Anxiety   . Depression    Situational    Family History  Problem Relation Age of Onset  . Cancer Mother        Ovarian  . Stroke Mother        Ocular  . Heart defect Father     Social History   Socioeconomic History  . Marital status: Single    Spouse name: Not on file  . Number of children: Not on file  . Years of education: Not on file  . Highest education level: Not on file  Occupational History  . Not on file  Tobacco Use  . Smoking status: Former Smoker    Types: Cigarettes  . Smokeless tobacco: Never Used  Vaping Use  . Vaping Use: Former  Substance and Sexual Activity  . Alcohol use: Not Currently    Comment: Social  . Drug use: Not Currently    Types: Marijuana  . Sexual activity: Yes  Other Topics Concern  . Not on file  Social History Narrative  . Not on file   Social Determinants of Health   Financial Resource Strain:   . Difficulty of Paying Living Expenses: Not on file  Food Insecurity:   . Worried About Programme researcher, broadcasting/film/video in the Last Year: Not on file  . Ran Out of Food in the Last Year: Not on file  Transportation Needs:   . Lack of Transportation (Medical): Not on file  . Lack of Transportation (Non-Medical): Not on file  Physical Activity:   . Days of Exercise per Week: Not on  file  . Minutes of Exercise per Session: Not on file  Stress:   . Feeling of Stress : Not on file  Social Connections:   . Frequency of Communication with Friends and Family: Not on file  . Frequency of Social Gatherings with Friends and Family: Not on file  . Attends Religious Services: Not on file  . Active Member of Clubs or Organizations: Not on file  . Attends Banker Meetings: Not on file  . Marital Status: Not on file    Allergies  Allergen Reactions  . Ibuprofen Hives    No current facility-administered medications on file prior to encounter.   Current Outpatient Medications on File Prior to Encounter  Medication Sig Dispense Refill  . cetirizine (ZYRTEC) 10 MG tablet Take 10 mg by mouth daily.    . Prenatal Vit-Fe Fumarate-FA (MULTIVITAMIN-PRENATAL) 27-0.8 MG TABS tablet Take 1 tablet by mouth daily at 12 noon.    . sertraline (ZOLOFT) 25 MG  tablet Take 25 mg by mouth daily.    . Doxylamine-Pyridoxine 10-10 MG TBEC Take 1-2 tablets by mouth 3 (three) times daily as needed (nausea). Start with 2 tablets every evening. If symptoms persist, add 1 tablet every morning. If symptoms persist, add 1 tablet at mid-day. 60 tablet 3     Review of Systems  Constitutional: Negative.   Gastrointestinal: Negative.   Genitourinary: Negative.      Physical Exam   Vitals:   09/15/19 1828  BP: 112/82  Pulse: 90  Resp: 16  Temp: 98.2 F (36.8 C)  SpO2: 97%  Height: 5\' 6"  (1.676 m)    Physical Exam Vitals and nursing note reviewed.  Constitutional:      General: She is not in acute distress.    Appearance: Normal appearance. She is normal weight.  HENT:     Head: Normocephalic and atraumatic.  Pulmonary:     Effort: Pulmonary effort is normal. No respiratory distress.  Abdominal:     Palpations: Abdomen is soft.     Tenderness: There is no abdominal tenderness.  Skin:    General: Skin is warm and dry.  Neurological:     Mental Status: She is alert.   Psychiatric:        Mood and Affect: Mood normal.        Behavior: Behavior normal.        Thought Content: Thought content normal.    Fetal Tracing:  Baseline: 140 Variability: moderate Accelerations: 10x10 Decelerations: small variable  Toco: none   MAU Course  Procedures  MDM Fetal tracing is reassuring. Good variability & accelerations. 2 small variable decels, reactive after. Patient reports 4 movements after an hour.   BPP 8/8 with normal AFI  Assessment and Plan  A: 1. Decreased fetal movement, third trimester, not applicable or unspecified fetus   2. [redacted] weeks gestation of pregnancy    P: Discharge home Fetal movement form Discussed reasons to return to MAU Keep f/u with ob/gyn next week   , NP 09/15/2019 7:25 PM

## 2019-10-20 LAB — OB RESULTS CONSOLE GBS: GBS: NEGATIVE

## 2019-11-11 ENCOUNTER — Other Ambulatory Visit: Payer: Self-pay

## 2019-11-11 ENCOUNTER — Inpatient Hospital Stay (HOSPITAL_COMMUNITY): Payer: 59 | Admitting: Anesthesiology

## 2019-11-11 ENCOUNTER — Inpatient Hospital Stay (HOSPITAL_COMMUNITY)
Admission: AD | Admit: 2019-11-11 | Discharge: 2019-11-14 | DRG: 788 | Disposition: A | Discharge status: Home / Self Care | Payer: 59 | Attending: Obstetrics & Gynecology | Admitting: Obstetrics & Gynecology

## 2019-11-11 ENCOUNTER — Encounter (HOSPITAL_COMMUNITY): Payer: Self-pay | Admitting: Obstetrics and Gynecology

## 2019-11-11 ENCOUNTER — Encounter (HOSPITAL_COMMUNITY)
Admission: AD | Disposition: A | Discharge status: Home / Self Care | Payer: Self-pay | Source: Home / Self Care | Attending: Obstetrics & Gynecology

## 2019-11-11 DIAGNOSIS — Z20822 Contact with and (suspected) exposure to covid-19: Secondary | ICD-10-CM | POA: Diagnosis present

## 2019-11-11 DIAGNOSIS — D509 Iron deficiency anemia, unspecified: Secondary | ICD-10-CM | POA: Diagnosis present

## 2019-11-11 DIAGNOSIS — Z98891 History of uterine scar from previous surgery: Secondary | ICD-10-CM

## 2019-11-11 DIAGNOSIS — R12 Heartburn: Secondary | ICD-10-CM | POA: Diagnosis present

## 2019-11-11 DIAGNOSIS — O9962 Diseases of the digestive system complicating childbirth: Secondary | ICD-10-CM | POA: Diagnosis present

## 2019-11-11 DIAGNOSIS — Z87891 Personal history of nicotine dependence: Secondary | ICD-10-CM

## 2019-11-11 DIAGNOSIS — F419 Anxiety disorder, unspecified: Secondary | ICD-10-CM | POA: Diagnosis present

## 2019-11-11 DIAGNOSIS — O26893 Other specified pregnancy related conditions, third trimester: Secondary | ICD-10-CM | POA: Diagnosis present

## 2019-11-11 DIAGNOSIS — O99344 Other mental disorders complicating childbirth: Secondary | ICD-10-CM | POA: Diagnosis present

## 2019-11-11 DIAGNOSIS — O9902 Anemia complicating childbirth: Secondary | ICD-10-CM | POA: Diagnosis present

## 2019-11-11 DIAGNOSIS — O99892 Other specified diseases and conditions complicating childbirth: Secondary | ICD-10-CM | POA: Diagnosis present

## 2019-11-11 DIAGNOSIS — Z3A39 39 weeks gestation of pregnancy: Secondary | ICD-10-CM | POA: Diagnosis not present

## 2019-11-11 DIAGNOSIS — O99214 Obesity complicating childbirth: Secondary | ICD-10-CM | POA: Diagnosis present

## 2019-11-11 LAB — CBC
HCT: 39.5 % (ref 36.0–46.0)
Hemoglobin: 12.7 g/dL (ref 12.0–15.0)
MCH: 27.2 pg (ref 26.0–34.0)
MCHC: 32.2 g/dL (ref 30.0–36.0)
MCV: 84.6 fL (ref 80.0–100.0)
Platelets: 238 10*3/uL (ref 150–400)
RBC: 4.67 MIL/uL (ref 3.87–5.11)
RDW: 15.7 % — ABNORMAL HIGH (ref 11.5–15.5)
WBC: 11.7 10*3/uL — ABNORMAL HIGH (ref 4.0–10.5)
nRBC: 0 % (ref 0.0–0.2)

## 2019-11-11 LAB — TYPE AND SCREEN
ABO/RH(D): A POS
Antibody Screen: NEGATIVE

## 2019-11-11 LAB — RESPIRATORY PANEL BY RT PCR (FLU A&B, COVID)
Influenza A by PCR: NEGATIVE
Influenza B by PCR: NEGATIVE
SARS Coronavirus 2 by RT PCR: NEGATIVE

## 2019-11-11 LAB — RPR: RPR Ser Ql: NONREACTIVE

## 2019-11-11 SURGERY — Surgical Case
Anesthesia: Epidural | Wound class: Clean Contaminated

## 2019-11-11 MED ORDER — NALBUPHINE HCL 10 MG/ML IJ SOLN: 5.0000 mg | Freq: Once | INTRAMUSCULAR | Status: DC | PRN

## 2019-11-11 MED ORDER — OXYTOCIN-SODIUM CHLORIDE 30-0.9 UT/500ML-% IV SOLN
INTRAVENOUS | Status: DC | PRN
  Administered 2019-11-11: 400 mL via INTRAVENOUS

## 2019-11-11 MED ORDER — LIDOCAINE-EPINEPHRINE (PF) 2 %-1:200000 IJ SOLN
INTRAMUSCULAR | Status: DC | PRN
  Administered 2019-11-11: 5 mL via EPIDURAL
  Administered 2019-11-11: 3 mL via EPIDURAL
  Administered 2019-11-11: 10 mL via EPIDURAL
  Administered 2019-11-11: 1 mL via EPIDURAL

## 2019-11-11 MED ORDER — DIPHENHYDRAMINE HCL 25 MG PO CAPS: 25.0000 mg | ORAL_CAPSULE | ORAL | Status: DC | PRN

## 2019-11-11 MED ORDER — SOD CITRATE-CITRIC ACID 500-334 MG/5ML PO SOLN
30.0000 mL | ORAL | Status: DC | PRN
  Administered 2019-11-11: 30 mL via ORAL
  Filled 2019-11-11: qty 15

## 2019-11-11 MED ORDER — MEPERIDINE HCL 25 MG/ML IJ SOLN
INTRAMUSCULAR | Status: AC
  Filled 2019-11-11: qty 1

## 2019-11-11 MED ORDER — LACTATED RINGERS IV SOLN: INTRAVENOUS | Status: DC

## 2019-11-11 MED ORDER — NALOXONE HCL 4 MG/10ML IJ SOLN
1.0000 ug/kg/h | INTRAVENOUS | Status: DC | PRN
  Filled 2019-11-11: qty 5

## 2019-11-11 MED ORDER — SCOPOLAMINE 1 MG/3DAYS TD PT72: 1.0000 | MEDICATED_PATCH | Freq: Once | TRANSDERMAL | Status: DC

## 2019-11-11 MED ORDER — OXYCODONE-ACETAMINOPHEN 5-325 MG PO TABS: 1.0000 | ORAL_TABLET | ORAL | Status: DC | PRN

## 2019-11-11 MED ORDER — NALBUPHINE HCL 10 MG/ML IJ SOLN: 5.0000 mg | INTRAMUSCULAR | Status: DC | PRN

## 2019-11-11 MED ORDER — SODIUM CHLORIDE (PF) 0.9 % IJ SOLN
INTRAMUSCULAR | Status: DC | PRN
  Administered 2019-11-11: 12 mL/h via EPIDURAL

## 2019-11-11 MED ORDER — ACETAMINOPHEN 325 MG PO TABS: 650.0000 mg | ORAL_TABLET | ORAL | Status: DC | PRN

## 2019-11-11 MED ORDER — FLEET ENEMA 7-19 GM/118ML RE ENEM: 1.0000 | ENEMA | Freq: Every day | RECTAL | Status: DC | PRN

## 2019-11-11 MED ORDER — DIPHENHYDRAMINE HCL 50 MG/ML IJ SOLN: 12.5000 mg | INTRAMUSCULAR | Status: DC | PRN

## 2019-11-11 MED ORDER — MEPERIDINE HCL 25 MG/ML IJ SOLN: 6.2500 mg | INTRAMUSCULAR | Status: DC | PRN

## 2019-11-11 MED ORDER — OXYCODONE HCL 5 MG/5ML PO SOLN: 5.0000 mg | Freq: Once | ORAL | Status: DC | PRN

## 2019-11-11 MED ORDER — OXYCODONE-ACETAMINOPHEN 5-325 MG PO TABS: 2.0000 | ORAL_TABLET | ORAL | Status: DC | PRN

## 2019-11-11 MED ORDER — LIDOCAINE HCL (PF) 1 % IJ SOLN
INTRAMUSCULAR | Status: DC | PRN
  Administered 2019-11-11: 8 mL via EPIDURAL

## 2019-11-11 MED ORDER — OXYCODONE HCL 5 MG PO TABS: 5.0000 mg | ORAL_TABLET | Freq: Once | ORAL | Status: DC | PRN

## 2019-11-11 MED ORDER — LIDOCAINE HCL (PF) 2 % IJ SOLN
INTRAMUSCULAR | Status: AC
  Filled 2019-11-11: qty 5

## 2019-11-11 MED ORDER — KETOROLAC TROMETHAMINE 30 MG/ML IJ SOLN: 30.0000 mg | Freq: Four times a day (QID) | INTRAMUSCULAR | Status: DC | PRN

## 2019-11-11 MED ORDER — MEPERIDINE HCL 25 MG/ML IJ SOLN
INTRAMUSCULAR | Status: DC | PRN
Start: 2019-11-11 — End: 2019-11-12
  Administered 2019-11-11 (×2): 12.5 mg via INTRAVENOUS

## 2019-11-11 MED ORDER — NALOXONE HCL 0.4 MG/ML IJ SOLN: 0.4000 mg | INTRAMUSCULAR | Status: DC | PRN

## 2019-11-11 MED ORDER — CEFAZOLIN SODIUM-DEXTROSE 2-4 GM/100ML-% IV SOLN: 2.0000 g | INTRAVENOUS | Status: DC

## 2019-11-11 MED ORDER — CEFAZOLIN SODIUM-DEXTROSE 2-3 GM-%(50ML) IV SOLR
INTRAVENOUS | Status: DC | PRN
  Administered 2019-11-11: 2 g via INTRAVENOUS

## 2019-11-11 MED ORDER — EPHEDRINE 5 MG/ML INJ
10.0000 mg | INTRAVENOUS | Status: DC | PRN
  Filled 2019-11-11: qty 2

## 2019-11-11 MED ORDER — LIDOCAINE HCL (PF) 1 % IJ SOLN: 30.0000 mL | INTRAMUSCULAR | Status: DC | PRN

## 2019-11-11 MED ORDER — MORPHINE SULFATE (PF) 10 MG/ML IV SOLN: INTRAVENOUS | Status: DC | PRN

## 2019-11-11 MED ORDER — ONDANSETRON HCL 4 MG/2ML IJ SOLN
INTRAMUSCULAR | Status: AC
  Filled 2019-11-11: qty 2

## 2019-11-11 MED ORDER — NALBUPHINE HCL 10 MG/ML IJ SOLN
5.0000 mg | INTRAMUSCULAR | Status: DC | PRN
  Administered 2019-11-12 – 2019-11-13 (×5): 5 mg via INTRAVENOUS
  Filled 2019-11-11 (×6): qty 1

## 2019-11-11 MED ORDER — LACTATED RINGERS IV SOLN: 500.0000 mL | Freq: Once | INTRAVENOUS | Status: DC

## 2019-11-11 MED ORDER — PHENYLEPHRINE 40 MCG/ML (10ML) SYRINGE FOR IV PUSH (FOR BLOOD PRESSURE SUPPORT)
PREFILLED_SYRINGE | INTRAVENOUS | Status: AC
  Filled 2019-11-11: qty 10

## 2019-11-11 MED ORDER — POVIDONE-IODINE 10 % EX SWAB: 2.0000 "application " | Freq: Once | CUTANEOUS | Status: DC

## 2019-11-11 MED ORDER — SODIUM CHLORIDE 0.9% FLUSH: 3.0000 mL | INTRAVENOUS | Status: DC | PRN

## 2019-11-11 MED ORDER — CEFAZOLIN SODIUM-DEXTROSE 2-4 GM/100ML-% IV SOLN
INTRAVENOUS | Status: AC
  Filled 2019-11-11: qty 100

## 2019-11-11 MED ORDER — OXYTOCIN-SODIUM CHLORIDE 30-0.9 UT/500ML-% IV SOLN
1.0000 m[IU]/min | INTRAVENOUS | Status: DC
  Administered 2019-11-11: 2 m[IU]/min via INTRAVENOUS

## 2019-11-11 MED ORDER — PHENYLEPHRINE HCL (PRESSORS) 10 MG/ML IV SOLN
INTRAVENOUS | Status: DC | PRN
  Administered 2019-11-11: 40 ug via INTRAVENOUS
  Administered 2019-11-11 (×2): 80 ug via INTRAVENOUS

## 2019-11-11 MED ORDER — FENTANYL-BUPIVACAINE-NACL 0.5-0.125-0.9 MG/250ML-% EP SOLN
12.0000 mL/h | EPIDURAL | Status: DC | PRN
  Filled 2019-11-11: qty 250

## 2019-11-11 MED ORDER — LACTATED RINGERS IV SOLN
500.0000 mL | INTRAVENOUS | Status: DC | PRN
  Administered 2019-11-11: 1000 mL via INTRAVENOUS

## 2019-11-11 MED ORDER — ACETAMINOPHEN 325 MG PO TABS: 325.0000 mg | ORAL_TABLET | ORAL | Status: DC | PRN

## 2019-11-11 MED ORDER — TERBUTALINE SULFATE 1 MG/ML IJ SOLN: 0.2500 mg | Freq: Once | INTRAMUSCULAR | Status: DC | PRN

## 2019-11-11 MED ORDER — ONDANSETRON HCL 4 MG/2ML IJ SOLN
INTRAMUSCULAR | Status: DC | PRN
  Administered 2019-11-11: 4 mg via INTRAVENOUS

## 2019-11-11 MED ORDER — ACETAMINOPHEN 160 MG/5ML PO SOLN: 325.0000 mg | ORAL | Status: DC | PRN

## 2019-11-11 MED ORDER — MORPHINE SULFATE (PF) 10 MG/ML IV SOLN
INTRAVENOUS | Status: DC | PRN
  Administered 2019-11-11: 3 mg via EPIDURAL

## 2019-11-11 MED ORDER — ONDANSETRON HCL 4 MG/2ML IJ SOLN
4.0000 mg | Freq: Four times a day (QID) | INTRAMUSCULAR | Status: DC | PRN
  Administered 2019-11-11: 4 mg via INTRAVENOUS
  Filled 2019-11-11: qty 2

## 2019-11-11 MED ORDER — PHENYLEPHRINE 40 MCG/ML (10ML) SYRINGE FOR IV PUSH (FOR BLOOD PRESSURE SUPPORT)
80.0000 ug | PREFILLED_SYRINGE | INTRAVENOUS | Status: DC | PRN
  Filled 2019-11-11: qty 10

## 2019-11-11 MED ORDER — OXYTOCIN-SODIUM CHLORIDE 30-0.9 UT/500ML-% IV SOLN
2.5000 [IU]/h | INTRAVENOUS | Status: DC
  Filled 2019-11-11: qty 500

## 2019-11-11 MED ORDER — ONDANSETRON HCL 4 MG/2ML IJ SOLN: 4.0000 mg | Freq: Three times a day (TID) | INTRAMUSCULAR | Status: DC | PRN

## 2019-11-11 MED ORDER — OXYTOCIN BOLUS FROM INFUSION: 333.0000 mL | Freq: Once | INTRAVENOUS | Status: DC

## 2019-11-11 MED ORDER — ONDANSETRON HCL 4 MG/2ML IJ SOLN: 4.0000 mg | Freq: Once | INTRAMUSCULAR | Status: DC | PRN

## 2019-11-11 MED ORDER — SODIUM CHLORIDE 0.9 % IR SOLN
Status: DC | PRN
  Administered 2019-11-11: 1000 mL

## 2019-11-11 MED ORDER — MORPHINE SULFATE (PF) 0.5 MG/ML IJ SOLN
INTRAMUSCULAR | Status: AC
  Filled 2019-11-11: qty 10

## 2019-11-11 MED ORDER — LACTATED RINGERS IV SOLN: INTRAVENOUS | Status: DC | PRN

## 2019-11-11 MED ORDER — FENTANYL CITRATE (PF) 100 MCG/2ML IJ SOLN: 25.0000 ug | INTRAMUSCULAR | Status: DC | PRN

## 2019-11-11 SURGICAL SUPPLY — 43 items
BENZOIN TINCTURE PRP APPL 2/3 (GAUZE/BANDAGES/DRESSINGS) ×3 IMPLANT
CHLORAPREP W/TINT 26ML (MISCELLANEOUS) ×3 IMPLANT
CLAMP CORD UMBIL (MISCELLANEOUS) IMPLANT
CLOSURE STERI-STRIP 1/2X4 (GAUZE/BANDAGES/DRESSINGS) ×1
CLOSURE WOUND 1/2 X4 (GAUZE/BANDAGES/DRESSINGS)
CLOTH BEACON ORANGE TIMEOUT ST (SAFETY) ×3 IMPLANT
CLSR STERI-STRIP ANTIMIC 1/2X4 (GAUZE/BANDAGES/DRESSINGS) ×2 IMPLANT
DRSG OPSITE POSTOP 4X10 (GAUZE/BANDAGES/DRESSINGS) ×3 IMPLANT
ELECT REM PT RETURN 9FT ADLT (ELECTROSURGICAL) ×3
ELECTRODE REM PT RTRN 9FT ADLT (ELECTROSURGICAL) ×1 IMPLANT
EXTRACTOR VACUUM KIWI (MISCELLANEOUS) IMPLANT
EXTRACTOR VACUUM M CUP 4 TUBE (SUCTIONS) IMPLANT
EXTRACTOR VACUUM M CUP 4' TUBE (SUCTIONS)
GLOVE BIO SURGEON STRL SZ7 (GLOVE) ×3 IMPLANT
GLOVE BIOGEL PI IND STRL 7.0 (GLOVE) ×2 IMPLANT
GLOVE BIOGEL PI INDICATOR 7.0 (GLOVE) ×4
GOWN STRL REUS W/TWL LRG LVL3 (GOWN DISPOSABLE) ×6 IMPLANT
HOVERMATT SINGLE USE (MISCELLANEOUS) ×3 IMPLANT
KIT ABG SYR 3ML LUER SLIP (SYRINGE) IMPLANT
NEEDLE HYPO 25X5/8 SAFETYGLIDE (NEEDLE) IMPLANT
NS IRRIG 1000ML POUR BTL (IV SOLUTION) ×3 IMPLANT
PACK C SECTION WH (CUSTOM PROCEDURE TRAY) ×3 IMPLANT
PAD ABD 8X10 STRL (GAUZE/BANDAGES/DRESSINGS) ×3 IMPLANT
PAD OB MATERNITY 4.3X12.25 (PERSONAL CARE ITEMS) ×3 IMPLANT
RTRCTR C-SECT PINK 25CM LRG (MISCELLANEOUS) IMPLANT
SPONGE GAUZE 4X4 12PLY STER LF (GAUZE/BANDAGES/DRESSINGS) ×6 IMPLANT
STRIP CLOSURE SKIN 1/2X4 (GAUZE/BANDAGES/DRESSINGS) IMPLANT
SUT MNCRL 0 VIOLET CTX 36 (SUTURE) ×2 IMPLANT
SUT MONOCRYL 0 CTX 36 (SUTURE) ×4
SUT PLAIN 0 NONE (SUTURE) IMPLANT
SUT PLAIN 2 0 (SUTURE)
SUT PLAIN 2 0 XLH (SUTURE) ×3 IMPLANT
SUT PLAIN ABS 2-0 CT1 27XMFL (SUTURE) IMPLANT
SUT VIC AB 0 CT1 27 (SUTURE) ×4
SUT VIC AB 0 CT1 27XBRD ANBCTR (SUTURE) ×2 IMPLANT
SUT VIC AB 2-0 CT1 27 (SUTURE) ×2
SUT VIC AB 2-0 CT1 TAPERPNT 27 (SUTURE) ×1 IMPLANT
SUT VIC AB 4-0 KS 27 (SUTURE) ×3 IMPLANT
SUT VICRYL 0 TIES 12 18 (SUTURE) IMPLANT
TAPE CLOTH SURG 4X10 WHT LF (GAUZE/BANDAGES/DRESSINGS) ×3 IMPLANT
TOWEL OR 17X24 6PK STRL BLUE (TOWEL DISPOSABLE) ×3 IMPLANT
TRAY FOLEY W/BAG SLVR 14FR LF (SET/KITS/TRAYS/PACK) IMPLANT
WATER STERILE IRR 1000ML POUR (IV SOLUTION) ×3 IMPLANT

## 2019-11-11 NOTE — Progress Notes (Signed)
Labor Progress Note  S/O: Patient comfortable with epidural. No complaints of rectal pressure  Vitals:   11/11/19 1900 11/11/19 1930  BP: 123/82 116/71  Pulse: 92 96  Resp:  17  Temp:  99.1 F (37.3 C)  SpO2:      SVE: 8-9/100/-2, caput, moulding but not much head descent. Hematuria noted  EFM: cat I baseline 160s bpm mod var +accels, -decels- cat I. Late decels seen earlier resolved with UCs spacing out.  Toco: ctxs q 2 min  A/P: 34 yo G2P0010 @ 39.2 labor IUPC in place, adequate labor MVUs, Pitocin per protocol. FHT baseline change from 150 to 160, late decels resolved, so back to cat I  Watch progress, working towards SVD but guarded, Was 7 cm at 12.45 pm and 8-9 cm since 5 pm. Reassess at 8.30-9 pm is any progress made.    Robley Fries 11/11/19 8:05 PM

## 2019-11-11 NOTE — Progress Notes (Signed)
Labor Progress Note  S/O: Patient comfortable with epidural. No complaints of rectal pressure  Vitals:   11/11/19 1431 11/11/19 1501  BP: 124/67 137/86  Pulse: 87 87  Resp:    Temp:    SpO2:      SVE: Per RN [facility]  8/100/-1 , bones narrow  EFM: cat I baseline 150s bpm mod var +accels, -decels- cat I Toco: ctxs q 2-3 min  A/P: 34 yo G2P0010 @ 39.2 labor GBS-/Rh+/RI Pitocin per protocol  Watch progress, working towards SVD   [employer] 11/11/19 3:58 PM

## 2019-11-11 NOTE — H&P (Signed)
Sydney Gonzalez is a 34 y.o. female G2P0010 [redacted]w[redacted]d presenting for labor. Reports onset of contractions around 1 AM, increasing in frequency and intensity since then. Reports some bloody show but no LOF. Endorses good FM overnight.   Patient was a transfer of care to [facility] at 25 weeks. Dated by 6w [place] and normal prenatal labs from outside facility including LR Panorama and anatomy US. Pregnancy S>D, growth US performed on 10/14 @ 37w showed EFW 6#10 (45%) with anterior placenta and AFI 14. The 1hr GTT was 134. She has been taking iron for IDA, pepcid for heart burn, cetirizine for allergies. History of anxiety previously on Klonopin prior to pregnancy, now on Zoloft daily which has been helping.   OB History    Gravida  2   Para      Term      Preterm      AB  1   Living  0     SAB  1   TAB      Ectopic      Multiple      Live Births             Past Medical History:  Diagnosis Date  . Anxiety   . Depression    Situational   Past Surgical History:  Procedure Laterality Date  . FRACTURE SURGERY     Right leg  . MOUTH SURGERY     Family History: family history includes Cancer in her mother; Heart defect in her father; Stroke in her mother. Social History:  reports that she has quit smoking. Her smoking use included cigarettes. She has never used smokeless tobacco. She reports previous alcohol use. She reports previous drug use. Drug: Marijuana.     Maternal Diabetes: No Genetic Screening: Normal Maternal Ultrasounds/Referrals: Normal Fetal Ultrasounds or other Referrals:  None Maternal Substance Abuse:  No Significant Maternal Medications:  Meds include: Zoloft Other: Cetirizine, Pepcid, Fe, PNV Significant Maternal Lab Results:  Group B Strep negative Other Comments:  None  Review of Systems  All other systems reviewed and are negative.  Per HPI Maternal Exam:  Uterine Assessment: Contraction frequency is regular.   Abdomen: Patient reports no abdominal  tenderness. Fetal presentation: vertex     Fetal Exam Fetal Monitor Review: Baseline rate: 145.  Variability: moderate (6-25 bpm).   Pattern: accelerations present and variable decelerations.    Fetal State Assessment: Category II - tracings are indeterminate.     Physical Exam Vitals reviewed.  Constitutional:      Appearance: Normal appearance.  HENT:     Head: Normocephalic.  Cardiovascular:     Rate and Rhythm: Normal rate.  Pulmonary:     Effort: Pulmonary effort is normal.  Musculoskeletal:        General: Normal range of motion.     Cervical back: Normal range of motion.  Skin:    General: Skin is warm and dry.  Neurological:     General: No focal deficit present.     Mental Status: She is alert and oriented to person, place, and time.  Psychiatric:        Mood and Affect: Mood normal.        Behavior: Behavior normal.     Dilation: 6 Effacement (%): 90 Station: -3 Exam by:: Alexis [place], RN Blood pressure 108/66, pulse 86, temperature 97.8 F (36.6 C), temperature source Oral, resp. rate 18, height 5\' 6"  (1.676 m), weight 112.5 kg, last menstrual period 02/09/2019, SpO2 99 %.  Prenatal labs: ABO, Rh:  --/--/A POS (10/29 1610) Antibody: NEG (10/29 9604) Rubella: Immune (07/23 0000) RPR: Nonreactive (08/13 0000)  HBsAg: Negative (07/23 0000)  HIV: NON REACTIVE (03/10 1241)  GBS: Negative/-- (10/07 0000)   Assessment/Plan: 33Y G2P0010 @ 39.2 in labor GBS-/Rh+/RI  -Admit to LD and initiate labor management -Routine admission labs -Epidural labor pain mgmt -Continuous EFM/Toco -Cat II tracing with intermittent variable decels but overall reassuring with mod variability and +accels, cont to monitor and perform fetal resuscitative measures as needed -Labor progressing well on own, consider Pitocin if ctxs space and AROM with further descent -Clear liquid diet -Anxiety continue Zoloft -Routine intrapartum care -Anticipate vaginal  delivery  Betzaida Cremeens A Ivy Puryear 11/11/2019, 9:21 AM

## 2019-11-11 NOTE — Anesthesia Procedure Notes (Signed)
Epidural Patient location during procedure: OB Start time: 11/11/2019 7:58 AM  Staffing Anesthesiologist: Mellody Dance, MD Performed: anesthesiologist   Preanesthetic Checklist Completed: patient identified, IV checked, site marked, risks and benefits discussed, monitors and equipment checked, pre-op evaluation and timeout performed  Epidural Patient position: sitting Prep: DuraPrep Patient monitoring: heart rate, cardiac monitor, continuous pulse ox and blood pressure Approach: midline Location: L3-L4 Injection technique: LOR saline  Needle:  Needle type: Tuohy  Needle gauge: 17 G Needle length: 9 cm Needle insertion depth: 6 cm Catheter type: closed end flexible Catheter size: 20 Guage Catheter at skin depth: 12 cm Test dose: negative and Other  Assessment Events: blood not aspirated, injection not painful, no injection resistance and negative IV test  Additional Notes Informed consent obtained prior to proceeding including risk of failure, 1% risk of PDPH, risk of minor discomfort and bruising.  Discussed rare but serious complications including epidural abscess, permanent nerve injury, epidural hematoma.  Discussed alternatives to epidural analgesia and patient desires to proceed.  Timeout performed pre-procedure verifying patient name, procedure, and platelet count.  Patient tolerated procedure well.

## 2019-11-11 NOTE — Anesthesia Preprocedure Evaluation (Signed)
Anesthesia Evaluation  Patient identified by MRN, date of birth, ID band Patient awake    Reviewed: Allergy & Precautions, NPO status , Patient's Chart, lab work & pertinent test results  Airway Mallampati: III  TM Distance: >3 FB Neck ROM: Full    Dental no notable dental hx.    Pulmonary former smoker,    Pulmonary exam normal breath sounds clear to auscultation       Cardiovascular Exercise Tolerance: Good negative cardio ROS Normal cardiovascular exam Rhythm:Regular Rate:Normal     Neuro/Psych PSYCHIATRIC DISORDERS Anxiety Depression    GI/Hepatic Neg liver ROS, GERD  ,  Endo/Other  Morbid obesity  Renal/GU negative Renal ROS     Musculoskeletal negative musculoskeletal ROS (+)   Abdominal (+) + obese,   Peds negative pediatric ROS (+)  Hematology negative hematology ROS (+)   Anesthesia Other Findings   Reproductive/Obstetrics (+) Pregnancy                             Anesthesia Physical Anesthesia Plan  ASA: III  Anesthesia Plan: Epidural   Post-op Pain Management:    Induction:   PONV Risk Score and Plan: 2 and Treatment may vary due to age or medical condition  Airway Management Planned:   Additional Equipment:   Intra-op Plan:   Post-operative Plan:   Informed Consent: I have reviewed the patients History and Physical, chart, labs and discussed the procedure including the risks, benefits and alternatives for the proposed anesthesia with the patient or authorized representative who has indicated his/her understanding and acceptance.       Plan Discussed with: Anesthesiologist  Anesthesia Plan Comments:         Anesthesia Quick Evaluation

## 2019-11-11 NOTE — Progress Notes (Signed)
Labor Progress Note  S/O: Patient comfortable with epidural. No complaints of rectal pressure  Vitals:   11/11/19 1630 11/11/19 1700  BP: 136/80 (!) 115/92  Pulse: 93 (!) 107  Resp:  16  Temp:    SpO2:      SVE: 9/100/-2, caput, moulding but not much head engagement. Borderline pelvis  EFM: cat I baseline 150s bpm mod var +accels, -decels- cat I Toco: ctxs q 2-3 min  A/P: 34 yo G2P0010 @ 39.2 labor IUPC placed, cont Pitocin per protocol  Watch progress, working towards SVD but guarded.  FHT- cat I and pt stable   Robley Fries 11/11/19 5:13 PM

## 2019-11-11 NOTE — MAU Note (Signed)
Ctxs since 0130. Bloody show. 1.5cm last sve.

## 2019-11-11 NOTE — Progress Notes (Signed)
Labor Progress Note  S/O: Patient comfortable with epidural. No complaints of rectal pressure  Vitals:   11/11/19 1330 11/11/19 1331  BP:  125/85  Pulse:  86  Resp:    Temp: 98.3 F (36.8 C)   SpO2:      SVE: 7/100/-2 SROM at time of exam meconium stained fluid  EFM: cat I baseline 145 bpm mod var +accels, -decels Toco: ctxs q 2-4 min  A/P: 33Y G2P0010 @ 39.2 labor GBS-/Rh+/RI  -Labor: s/p SROM with last exam, cont to expectant mgmt, add Pitocin 2x2 if ctxs not every 2-3 min -Cont EFM/Toco -Cat I tracing -Epidural labor pain mgmt -Routine intrapartum care -Anticipate vaginal delivery  Sydney Gonzalez A Chan Rosasco 11/11/19 2:02 PM

## 2019-11-11 NOTE — Progress Notes (Addendum)
Labor Progress Note Labor admit. Protracted course since 10 am when she was 7 cm and 9 cm since 5 pm.   S: No pressure   Vitals:   11/11/19 2100 11/11/19 2130  BP: 131/87 (!) 130/97  Pulse: 100 (!) 107  Resp:  18  Temp:  98.8 F (37.1 C)  SpO2:      SVE: 9/100/-2, caput, moulding but not much head descent. Hematuria noted  EFM: cat I baseline 160s bpm mod var +accels, -decels- cat I. Late decels seen earlier resolved with UCs spacing out.  Toco: ctxs q 2 min  A/P: 34 yo G2P0010 @ 39.2 labor Arrest of dilatation and descent, recommend C-section  Risks/complications of surgery reviewed incl infection, bleeding, damage to internal organs including bladder, bowels, ureters, blood vessels, other risks from anesthesia, VTE and delayed complications of any surgery, complications in future surgery reviewed. Also discussed neonatal complications incl difficult delivery, laceration, vacuum assistance, TTN etc. Pt understands and agrees, all concerns addressed.    Sydney Gonzalez 11/11/19 10:14 PM

## 2019-11-12 ENCOUNTER — Encounter (HOSPITAL_COMMUNITY): Payer: Self-pay | Admitting: Obstetrics and Gynecology

## 2019-11-12 DIAGNOSIS — O99892 Other specified diseases and conditions complicating childbirth: Secondary | ICD-10-CM | POA: Diagnosis present

## 2019-11-12 DIAGNOSIS — Z98891 History of uterine scar from previous surgery: Secondary | ICD-10-CM

## 2019-11-12 LAB — CBC
HCT: 31.8 % — ABNORMAL LOW (ref 36.0–46.0)
Hemoglobin: 10.1 g/dL — ABNORMAL LOW (ref 12.0–15.0)
MCH: 27.2 pg (ref 26.0–34.0)
MCHC: 31.8 g/dL (ref 30.0–36.0)
MCV: 85.7 fL (ref 80.0–100.0)
Platelets: 188 10*3/uL (ref 150–400)
RBC: 3.71 MIL/uL — ABNORMAL LOW (ref 3.87–5.11)
RDW: 15.8 % — ABNORMAL HIGH (ref 11.5–15.5)
WBC: 11.5 10*3/uL — ABNORMAL HIGH (ref 4.0–10.5)
nRBC: 0 % (ref 0.0–0.2)

## 2019-11-12 MED ORDER — SENNOSIDES-DOCUSATE SODIUM 8.6-50 MG PO TABS
2.0000 | ORAL_TABLET | ORAL | Status: DC
  Administered 2019-11-12 – 2019-11-14 (×2): 2 via ORAL
  Filled 2019-11-12 (×3): qty 2

## 2019-11-12 MED ORDER — PRENATAL MULTIVITAMIN CH
1.0000 | ORAL_TABLET | Freq: Every day | ORAL | Status: DC
  Administered 2019-11-12 – 2019-11-13 (×2): 1 via ORAL
  Filled 2019-11-12 (×2): qty 1

## 2019-11-12 MED ORDER — LORATADINE 10 MG PO TABS
10.0000 mg | ORAL_TABLET | Freq: Every day | ORAL | Status: DC
  Administered 2019-11-12 – 2019-11-14 (×3): 10 mg via ORAL
  Filled 2019-11-12 (×3): qty 1

## 2019-11-12 MED ORDER — ZOLPIDEM TARTRATE 5 MG PO TABS: 5.0000 mg | ORAL_TABLET | Freq: Every evening | ORAL | Status: DC | PRN

## 2019-11-12 MED ORDER — OXYTOCIN-SODIUM CHLORIDE 30-0.9 UT/500ML-% IV SOLN
2.5000 [IU]/h | INTRAVENOUS | Status: AC
  Administered 2019-11-12: 2.5 [IU]/h via INTRAVENOUS
  Filled 2019-11-12: qty 500

## 2019-11-12 MED ORDER — SIMETHICONE 80 MG PO CHEW: 80.0000 mg | CHEWABLE_TABLET | ORAL | Status: DC

## 2019-11-12 MED ORDER — DIPHENHYDRAMINE HCL 25 MG PO CAPS
25.0000 mg | ORAL_CAPSULE | Freq: Four times a day (QID) | ORAL | Status: DC | PRN
  Administered 2019-11-12 (×2): 25 mg via ORAL
  Filled 2019-11-12 (×2): qty 1

## 2019-11-12 MED ORDER — SIMETHICONE 80 MG PO CHEW: 80.0000 mg | CHEWABLE_TABLET | ORAL | Status: DC | PRN

## 2019-11-12 MED ORDER — MENTHOL 3 MG MT LOZG: 1.0000 | LOZENGE | OROMUCOSAL | Status: DC | PRN

## 2019-11-12 MED ORDER — COCONUT OIL OIL
1.0000 "application " | TOPICAL_OIL | Status: DC | PRN
  Administered 2019-11-13: 1 via TOPICAL

## 2019-11-12 MED ORDER — FAMOTIDINE 20 MG PO TABS
40.0000 mg | ORAL_TABLET | Freq: Every day | ORAL | Status: DC
  Administered 2019-11-12 – 2019-11-14 (×3): 40 mg via ORAL
  Filled 2019-11-12 (×3): qty 2

## 2019-11-12 MED ORDER — ACETAMINOPHEN 10 MG/ML IV SOLN
1000.0000 mg | Freq: Once | INTRAVENOUS | Status: AC
  Administered 2019-11-12: 1000 mg via INTRAVENOUS

## 2019-11-12 MED ORDER — OXYCODONE HCL 5 MG PO TABS
5.0000 mg | ORAL_TABLET | Freq: Four times a day (QID) | ORAL | Status: DC | PRN
  Administered 2019-11-12 – 2019-11-14 (×5): 5 mg via ORAL
  Filled 2019-11-12 (×5): qty 1

## 2019-11-12 MED ORDER — SIMETHICONE 80 MG PO CHEW
80.0000 mg | CHEWABLE_TABLET | Freq: Three times a day (TID) | ORAL | Status: DC
  Administered 2019-11-12 – 2019-11-14 (×7): 80 mg via ORAL
  Filled 2019-11-12 (×7): qty 1

## 2019-11-12 MED ORDER — ACETAMINOPHEN 10 MG/ML IV SOLN
INTRAVENOUS | Status: AC
  Filled 2019-11-12: qty 100

## 2019-11-12 MED ORDER — PRENATAL 27-0.8 MG PO TABS: 1.0000 | ORAL_TABLET | Freq: Every day | ORAL | Status: DC

## 2019-11-12 MED ORDER — WITCH HAZEL-GLYCERIN EX PADS: 1.0000 "application " | MEDICATED_PAD | CUTANEOUS | Status: DC | PRN

## 2019-11-12 MED ORDER — ACETAMINOPHEN 325 MG PO TABS
650.0000 mg | ORAL_TABLET | ORAL | Status: DC | PRN
  Administered 2019-11-12 (×4): 650 mg via ORAL
  Filled 2019-11-12 (×4): qty 2

## 2019-11-12 MED ORDER — TETANUS-DIPHTH-ACELL PERTUSSIS 5-2.5-18.5 LF-MCG/0.5 IM SUSY: 0.5000 mL | PREFILLED_SYRINGE | Freq: Once | INTRAMUSCULAR | Status: DC

## 2019-11-12 MED ORDER — OXYCODONE HCL 5 MG PO TABS: 10.0000 mg | ORAL_TABLET | Freq: Four times a day (QID) | ORAL | Status: DC | PRN

## 2019-11-12 MED ORDER — LACTATED RINGERS IV SOLN: INTRAVENOUS | Status: DC

## 2019-11-12 MED ORDER — DIBUCAINE (PERIANAL) 1 % EX OINT: 1.0000 "application " | TOPICAL_OINTMENT | CUTANEOUS | Status: DC | PRN

## 2019-11-12 MED ORDER — SERTRALINE HCL 25 MG PO TABS
25.0000 mg | ORAL_TABLET | Freq: Every day | ORAL | Status: DC
  Administered 2019-11-12 – 2019-11-14 (×3): 25 mg via ORAL
  Filled 2019-11-12 (×3): qty 1

## 2019-11-12 MED ORDER — ACETAMINOPHEN 500 MG PO TABS
1000.0000 mg | ORAL_TABLET | Freq: Four times a day (QID) | ORAL | Status: DC | PRN
  Administered 2019-11-13: 1000 mg via ORAL
  Filled 2019-11-12: qty 2

## 2019-11-12 NOTE — Op Note (Signed)
Cesarean Section Procedure Note   Sydney Gonzalez  11/11/2019  Indications: Arrest of dilatation at 9 cm and arrest of descent    Pre-operative Diagnosis: Arrest of dilatation and descent   Post-operative Diagnosis: Same   Surgeon:  Shea Evans, MD - Primary   Assistants: Carlean Jews, CNM  Anesthesia: epidural   Procedure Details:  The patient was seen in the Labor Room. The risks, benefits, complications, treatment options, and expected outcomes were discussed with the patient. The patient concurred with the proposed plan, giving informed consent. identified as [name] and the procedure verified as C-Section Delivery. A Time Out was held and the above information confirmed.  After induction of anesthesia, the patient was draped and prepped in the usual sterile manner, foley was draining urine well.  A pfannenstiel incision was made and carried down through the subcutaneous tissue to the fascia. Fascial incision was made and extended transversely. The fascia was separated from the underlying rectus tissue superiorly and inferiorly. The peritoneum was identified and entered. Peritoneal incision was extended longitudinally. Alexis-O retractor placed. The utero-vesical peritoneal reflection was incised transversely and the bladder flap was bluntly freed from the lower uterine segment. A low transverse uterine incision was made. Thick meconium stained amniotic fluid expelled. Baby was wedged in mid-pelvis, so RN Harmony helped with elevating head per vagina. Delivered from cephalic presentation was a female infant with vigorous cry at 23.05 PM. Apgar scores of 9 at one minute and 9 at five minutes. Delayed cord clamping done at 1 minute and baby handed to NICU team in attendance. Cord ph was not sent. Cord blood was obtained for evaluation. The placenta was removed Intact and appeared normal. The uterine outline, tubes and ovaries appeared normal. The uterine incision was closed with  running locked sutures of . A second imbricating layer sutured.   Hemostasis was observed. Alexis retractor removed. Peritoneal closure done with 2-0 Vicryl.  The fascia was then reapproximated with running sutures of 0Vicryl. The subcuticular closure was performed using 2-0plain gut. The skin was closed with 4-0Vicryl. Steristrips and honeycomb dressing placed.   Instrument, sponge, and needle counts were correct prior the abdominal closure and were correct at the conclusion of the case.   Findings: Sharl Ma hysterotomy with 2 layer closure. Thick mec. Girl delviered cephalic, Apgars 9 and 9.    Estimated Blood Loss: 436 cc  Total IV Fluids: LR 2100 cc  Urine Output: 150CC OF clear urine at the end of procedure  Specimens: cord blood  Complications: no complications  Disposition: PACU - hemodynamically stable.   Maternal Condition: stable   Baby condition / location:  Couplet care / Skin to Skin  Attending Attestation: I performed the procedure.   Signed: Surgeon(s): Shea Evans, MD

## 2019-11-12 NOTE — Transfer of Care (Signed)
Immediate Anesthesia Transfer of Care Note  Patient: Sydney Gonzalez  Procedure(s) Performed: CESAREAN SECTION (N/A )  Patient Location: PACU  Anesthesia Type:Epidural  Level of Consciousness: awake, alert  and patient cooperative  Airway & Oxygen Therapy: Patient Spontanous Breathing  Post-op Assessment: Report given to RN and Post -op Vital signs reviewed and stable  Post vital signs: Reviewed and stable  Last Vitals:  Vitals Value Taken Time  BP 126/90 11/12/19 0002  Temp    Pulse 99 11/12/19 0005  Resp 18 11/12/19 0005  SpO2 96 % 11/12/19 0005  Vitals shown include unvalidated device data.  Last Pain:  Vitals:   11/11/19 2130  TempSrc: Oral  PainSc: 0-No pain      Patients Stated Pain Goal: 0 (11/11/19 8182)  Complications: No complications documented.

## 2019-11-12 NOTE — Lactation Note (Signed)
This note was copied from a baby's chart. Lactation Consultation Note  Patient Name: Sydney Gonzalez [id number] Date: 11/12/2019  [name] Sydney Gonzalez now 60 hours old.  Entered room and infant laying with dad and cuing.  Mom reports she just fed her.   Discused baby learing to breastfeed and sometimes they want dessert.  Urged mom to offer both breasts at a meal and then hand express and spoon feed drops of colostrum past breastfeeding.  Mom has short/ flat nipples that do not evert much with stimulation at this time.   Mom reports she has been using a nipple shield. Using a 20 nipple shield.  Mom has sore/bruised and reddened nipples.   Asked mom if we could try and latch her without the nipple shield.  Used manual pump to try and elongate nipple prior to latching.  After a few attempts and t cupping nipple infant would not latch and mom very sleepy. Mom almost falling off the bed. LC Did some hand expression and spoon feeding and gave her to dad to do STS and urged mom to take a nap,  Mom with no breastfeeding education.  Discussed nipple shield use and pumping with DEBP.  Urged to call when infant was cuing.    Maternal Data    Feeding Feeding Type: Breast Fed  [facility] Score                   Interventions    Lactation Tools Discussed/Used     Consult Status      Sydney Gonzalez 11/12/2019, 5:51 PM

## 2019-11-12 NOTE — Progress Notes (Signed)
Subjective: Postpartum Day 1 Primary Low Transverse Cesarean Delivery for arrest of dilatation for 5 hours  Patient reports tolerating reg diet, ambulating., not voided yet, foley out 2 hrs back. Lochia moderate.   Objective: Vital signs in last 24 hours: Temp:  [97.9 F (36.6 C)-100.5 F (38.1 C)] 98.2 F (36.8 C) (10/30 1300) Pulse Rate:  [82-108] 87 (10/30 1300) Resp:  [14-22] 17 (10/30 1300) BP: (106-138)/(65-97) 119/65 (10/30 1300) SpO2:  [95 %-100 %] 98 % (10/30 1300)  Patient Vitals for the past 24 hrs:  BP Temp Temp src Pulse Resp SpO2  11/12/19 1300 119/65 98.2 F (36.8 C) Axillary 87 17 98 %  11/12/19 1100 -- -- -- -- -- 97 %  11/12/19 0911 -- -- -- -- -- 98 %  11/12/19 0845 121/67 98 F (36.7 C) -- 90 17 97 %  11/12/19 0644 -- -- -- -- 18 96 %  11/12/19 0442 -- 98.2 F (36.8 C) Oral -- 18 96 %  11/12/19 0342 115/66 98.6 F (37 C) Oral 94 18 100 %  11/12/19 0219 114/73 98.9 F (37.2 C) Oral 98 18 95 %  11/12/19 0127 138/82 98.8 F (37.1 C) Oral 99 18 96 %  11/12/19 0100 (!) 106/92 (!) 100.5 F (38.1 C) Axillary (!) 103 20 96 %  11/12/19 0045 114/75 -- -- 94 20 98 %  11/12/19 0030 113/90 -- -- 100 (!) 21 96 %  11/12/19 0016 125/74 -- -- 93 (!) 22 --  11/12/19 0015 125/74 -- -- 94 14 97 %  11/12/19 0000 125/81 97.9 F (36.6 C) -- 96 (!) 22 97 %  11/11/19 2200 126/84 -- -- (!) 108 -- --  11/11/19 2130 (!) 130/97 98.8 F (37.1 C) Oral (!) 107 18 --  11/11/19 2100 131/87 -- -- 100 -- --  11/11/19 2030 115/69 -- -- 84 16 --  11/11/19 2000 125/77 -- -- 93 -- --  11/11/19 1930 116/71 99.1 F (37.3 C) Oral 96 17 --  11/11/19 1900 123/82 -- -- 92 -- --  11/11/19 1830 132/75 -- -- 84 -- --  11/11/19 1800 135/87 98.4 F (36.9 C) Oral 92 18 --  11/11/19 1730 138/85 -- -- 97 -- --  11/11/19 1700 (!) 115/92 -- -- (!) 107 16 --  11/11/19 1630 136/80 -- -- 93 -- --  11/11/19 1613 -- 98.8 F (37.1 C) Oral -- -- --  11/11/19 1600 134/90 -- -- (!) 101 17 --  11/11/19  1530 (!) 134/95 -- -- (!) 101 -- --  11/11/19 1501 137/86 -- -- 87 -- --  11/11/19 1431 124/67 -- -- 87 -- --  11/11/19 1405 115/75 -- -- 82 16 --  11/11/19 1331 125/85 -- -- 86 -- --  11/11/19 1330 -- 98.3 F (36.8 C) Oral -- -- --   Physical Exam:  General: cooperative Lochia: appropriate Uterine Fundus: firm Incision: healing well DVT Evaluation: No evidence of DVT seen on physical exam.  CBC CBC Latest Ref Rng & Units 11/11/2019 04/16/2019 03/23/2019  WBC 4.0 - 10.5 K/uL 11.7(H) 10.9(H) 8.9  Hemoglobin 12.0 - 15.0 g/dL 09.3 81.8 29.9  Hematocrit 36 - 46 % 39.5 41.3 40.0  Platelets 150 - 400 K/uL 238 354 327    Assessment/Plan: Status post Cesarean section. Doing well postoperatively.  Continue current care.  Susa Day R Mollee Neer 11/12/2019, 1:20 PM

## 2019-11-12 NOTE — Lactation Note (Signed)
This note was copied from a baby's chart. Lactation Consultation Note  Patient Name: Sydney Gonzalez Date: 11/12/2019 Reason for consult: Follow-up assessment;Mother's request;Difficult latch;Primapara;1st time breastfeeding;Term Baby 22hrs old, called to mom's room by Sydney Pulley, RN for latch assistance. Mom resting in bed, dad sitting on couch holding sleeping baby. Mom reports baby will not latch without nipple shield, requests help with feedings. Mom's goal is to exclusively breastfeed, however states if supplementation is needed desires EBM first then formula. Mom has a DEBP at home. Assisted mom with latching baby to right breast football hold, no latch achieved, pre-pumped with hand pump and applied 71mm nipple shield, baby latched shallowly, sucked only when stimulated. Mom states unsure if baby is sucking or biting when at breast. Baby with disorganized suck on LC's gloved finger, thrusting and biting. Baby noted with tight frenulum to upper lip, blanching noted when elevating lip, tight frenulum noted under tongue, cupping noted on elevation. LC hand expressed ~50ml colostrum, dad spoon fed back to baby, baby with rhythmic suck, ~75ml colostrum applied to shield via curved tip syringe, mom latched baby without difficulty multiple audible swallows noted, baby nursed ~45mins released breast on own fell asleep, colostrum noted on breast and shield. Mom and LC hand expressed additional ~73mls colostrum and placed in fridge for next feeding. Mom setup with DEBP, reviewed pump frequency, cleanup and milk storage.  Discussed cue based feedings, expect 8-12 in 24hrs, wake if >3hrs since last feeding, skin to skin for each feeding, cluster feeding, signs of adequate milk transfer, expected length of feeding, engorgement and how to manage, [facility] BF brochure with numbers for LC support. Mom voiced understanding and with no further concerns. Left the room with mom pumping. Sydney Pulley, RN updated on plan.  BGilliam, RN, IBCLC  Plan - feed on cue, wake if >3hrs since last feeding - skin to skin for each feeding - pre-pump with hand pump and apply 40mm nipple shield for each feeding - apply 1-41ml colostrum to nipple shield via curved tip syringe before latching - hand express and pump after each feeding, offer back to baby - have 1-25ml colostrum ready to apply to nipple shield for next feeding * consider supplementation with formula if mom with difficulty latching and with low volume of EBM   Maternal Data Formula Feeding for Exclusion: No  Feeding Feeding Type: Breast Fed  LATCH Score Latch: Grasps breast easily, tongue down, lips flanged, rhythmical sucking.  Audible Swallowing: Spontaneous and intermittent  Type of Nipple: Flat  Comfort (Breast/Nipple): Filling, red/small blisters or bruises, mild/mod discomfort  Hold (Positioning): No assistance needed to correctly position infant at breast.  LATCH Score: 8  Interventions Interventions: Breast feeding basics reviewed;Assisted with latch;Skin to skin;Breast massage;Hand express;Pre-pump if needed;Breast compression;Adjust position;Position options;Support pillows;Expressed milk;Hand pump;DEBP;Comfort gels  Lactation Tools Discussed/Used Tools: Nipple Shields Nipple shield size: 24 WIC Program: No   Consult Status Consult Status: Follow-up Date: 11/13/19 Follow-up type: In-patient    Sydney Gonzalez 11/12/2019, 9:53 PM

## 2019-11-12 NOTE — Anesthesia Postprocedure Evaluation (Signed)
Anesthesia Post Note  Patient: Sydney Gonzalez  Procedure(s) Performed: CESAREAN SECTION (N/A )     Patient location during evaluation: Mother Baby Anesthesia Type: Epidural Level of consciousness: awake and alert Pain management: pain level controlled Vital Signs Assessment: post-procedure vital signs reviewed and stable Respiratory status: spontaneous breathing, nonlabored ventilation and respiratory function stable Cardiovascular status: stable Postop Assessment: no headache, no backache and epidural receding Anesthetic complications: no   No complications documented.  Last Vitals:  Vitals:   11/12/19 0045 11/12/19 0100  BP: 114/75 (!) 106/92  Pulse: 94 (!) 103  Resp: 20 20  Temp:  (!) 38.1 C  SpO2: 98% 96%    Last Pain:  Vitals:   11/12/19 0100  TempSrc: Axillary  PainSc: 2    Pain Goal: Patients Stated Pain Goal: 0 (11/11/19 8937)  LLE Motor Response: Purposeful movement (11/12/19 0100) LLE Sensation: Tingling (11/12/19 0100) RLE Motor Response: Purposeful movement (11/12/19 0100) RLE Sensation: Tingling (11/12/19 0100)     Epidural/Spinal Function Cutaneous sensation: Tingles (11/12/19 0100), Patient able to flex knees: Yes (11/12/19 0100), Patient able to lift hips off bed: Yes (11/12/19 0100), Back pain beyond tenderness at insertion site: No (11/12/19 0100), Progressively worsening motor and/or sensory loss: No (11/12/19 0100), Bowel and/or bladder incontinence post epidural: No (11/12/19 0100)  Ladarrius Bogdanski

## 2019-11-12 NOTE — Progress Notes (Signed)
CSW acknowledged consult and attempted to meet with MOB. However, MOB requested CSW return later, as MOB ws trying to feed infant.  CSW will meet with MOB at a later time.  Jaylean Buenaventura D. Dortha Kern, MSW, LCSW Clinical Social Worker [phone]

## 2019-11-13 MED ORDER — ACETAMINOPHEN 500 MG PO TABS
1000.0000 mg | ORAL_TABLET | Freq: Four times a day (QID) | ORAL | Status: DC | PRN
  Administered 2019-11-13 – 2019-11-14 (×4): 1000 mg via ORAL
  Filled 2019-11-13 (×4): qty 2

## 2019-11-13 NOTE — Progress Notes (Signed)
CSW received consult for hx of Anxiety and Depression.  CSW met with MOB at bedside to offer support and complete assessment.  On arrival, CSW introduced self and stated visit purpose. MGM and infant Alexandra were present, however, after PPD/A and SIDS education, MGM stepped out to offer MOB privacy during assessment. MOB and MGM were pleasant and engaged during visit.   CSW provided education regarding the baby blues period vs. perinatal mood disorders, discussed treatment and gave resources for mental health follow up if concerns arise.  CSW recommends self-evaluation during the postpartum time period using the New Mom Checklist from Postpartum Progress and encouraged MOB and MGM to contact a medical professional if symptoms are noted at any time. MOB and MGM stated understanding and denied any questions. MGM was open about her personal express with postpartum after first child and stated commitment to observe MOB and offer support if needed.   CSW provided review of Sudden Infant Death Syndrome (SIDS) precautions. MOB and MGM stated understanding and denied any questions. MOB confirmed having all needed items for baby including car seat and both crib and bassinet for baby's safe sleep.   During assessment, MOB confirmed hx of anxiety and depression. MOB identified sx as poor sleep, crying, and panic. MOB reported active Rx Zoloft 25mg has been managing sx well "it has been incredible". MOB denied any other BH dx, SI, HI, or domestic violence. MOB reports seeing therapist Jennifer Becker with [facility] bi weekly for years. MOB identified therapist, FOB, mom, and friends as support, and current mood as "very excited". MOB declines any additional resources or supports at this time.     CSW identifies no further need for intervention and no barriers to discharge at this time.  Dawud Mays D. Bron Snellings, MSW, LCSW Clinical Social Worker [phone] 

## 2019-11-13 NOTE — Progress Notes (Signed)
Subjective: Postpartum Day 2: Cesarean Delivery for arrest of descent and dilatation at 9 cm for 5 hrs  Patient reports incisional pain, tolerating PO, + flatus and no problems voiding but pain is controlled with TYlenol and Oxycodone (cant take Motrin- allergy)  Objective: Vital signs in last 24 hours: Temp:  [97.8 F (36.6 C)-98.7 F (37.1 C)] 98.7 F (37.1 C) (10/31 0817) Pulse Rate:  [89-97] 97 (10/31 0817) Resp:  [17-20] 20 (10/31 0817) BP: (107-121)/(64-86) 114/86 (10/31 0817) SpO2:  [98 %] 98 % (10/31 0817)  Physical Exam:  General: alert, cooperative and appears stated age  Lungs CTA bilat CV RRR Lochia: appropriate Uterine Fundus: firm Incision: healing well, no significant drainage DVT Evaluation: No evidence of DVT seen on physical exam.  CBC Latest Ref Rng & Units 11/12/2019 11/11/2019 04/16/2019  WBC 4.0 - 10.5 K/uL 11.5(H) 11.7(H) 10.9(H)  Hemoglobin 12.0 - 15.0 g/dL 10.1(L) 12.7 13.6  Hematocrit 36 - 46 % 31.8(L) 39.5 41.3  Platelets 150 - 400 K/uL 188 238 354    Assessment/Plan: Status post Cesarean section. Doing well postoperatively.  Continue current care Ambulate, okay to shower and remove pressure dressing. [facility] mngmt discussed  Girl- breast feeding continue lactation support   Anticipate D/c tomorrow .  Robley Fries 11/13/2019, 1:27 PM

## 2019-11-13 NOTE — Lactation Note (Signed)
This note was copied from a baby's chart. Lactation Consultation Note  Patient Name: [name] [id number]'[id number] Date: 11/13/2019   Baby girl [name] now 6 hours old 6 percent weight loss.  Parents report adequate voids and stools.Mom reports nipples starting to get extremely sore.  Assisted with latch.  Mom has been using both 22 and 24 nipple shield.  Mom reports baby's mouth may be too small to use nipple shield.   Assisted with latching at first with 22 mm nipple shield.  Nipple unable to go down into nipple shield tunnel.  Assist with 24 mm nipple shield.  Assisted mom in cross cradle fold on right breast.  Mom reports she usu;lly does football position. In cross cradle, nipple was round and elongated when infant came off the breast. Mom did not appear comfortable, so to [facility] told her to go ahead and switch to football hold. Infant appeared latched well.  However, when infant came off nipple was pinched and compressed.  Showed mom. She reports she has not been checking her nipples.  Let her know that could be one of the reasons her nipples sore that infant has been compressing them in nipple shield.  Urged her to stretch nipple shield really good and to tch it and turn inside out and hold in place until infant is latched well. Urged mom to hold her breast while breastfeeding with nipple shield. Quickly moving her hands back.  Mom reports breastfeeding always feels the same.  Unable to know if its a pinch, or pain . Urged Hand expression and rub expressed mothers milk on nipples and air dry. Urged mom to use the comfort gels given and put one on her left breast/nipple. Urged using manual pump to prepumnp nipple and massage/hand expression and pumping with DEBP after feedings and feeding back all expressed mothers milk.  Urged parents to call lactation as needed.  Maternal Data    Feeding    LATCH Score                   Interventions    Lactation Tools Discussed/Used     Consult  Status      Bryna Razavi Michaelle Copas 11/13/2019, 4:42 PM

## 2019-11-14 DIAGNOSIS — F419 Anxiety disorder, unspecified: Secondary | ICD-10-CM

## 2019-11-14 MED ORDER — OXYCODONE HCL 5 MG PO TABS
5.0000 mg | ORAL_TABLET | Freq: Four times a day (QID) | ORAL | 0 refills | Status: DC | PRN
Start: 2019-11-14 — End: 2020-07-31

## 2019-11-14 MED ORDER — COCONUT OIL OIL: 1.0000 "application " | TOPICAL_OIL | 0 refills | Status: DC | PRN

## 2019-11-14 MED ORDER — ACETAMINOPHEN 500 MG PO TABS: 1000.0000 mg | ORAL_TABLET | Freq: Four times a day (QID) | ORAL | 0 refills | Status: DC | PRN

## 2019-11-14 NOTE — Lactation Note (Signed)
This note was copied from a baby's chart. Lactation Consultation Note  Patient Name: Girl Sevana Grandinetti [id number]'[id number] Date: 11/14/2019 Reason for consult: Follow-up assessment   Baby 62 hours old. Infant sucking on pacifier when LC entered room.  FOB supportive in caring for baby. Discussed waiting on pacifier use until 15 weeks old until breastfeeding is well established so baby does not miss feeding cues. No stool in 24 hours. Numerous voids. Mother's nipples red and sore.  Mother's nipples flat.  Using #24NS to latch. Mother alternating with comfort gels and coconut oil/ebm.  Helped mother to breastfeed in chair in more laid back position. Observed feeding with frequent swallows and colostrum in NS after feeding. Mother has not been pumping as directed due to discomfort from C-section but is agreeable to start. Recommend pumping after each feeding and give volume back to baby. Mother had DEBP at home. Reiterated the importance of stooling within the remainder of day and baby needing more volume.  Mother did not know yet when Peds MD appt was yet.          Maternal Data    Feeding Feeding Type: Breast Fed  LATCH Score                   Interventions Interventions: Breast feeding basics reviewed  Lactation Tools Discussed/Used Tools: Nipple Shields Nipple shield size: 24   Consult Status Consult Status: Complete Date: 11/14/19 Follow-up type: In-patient    Dahlia Byes [facility] 11/14/2019, 2:10 PM

## 2019-11-14 NOTE — Discharge Summary (Signed)
OB Discharge Summary  Patient Name: Sydney Gonzalez DOB: [birth year] MRN: [id number]  Date of admission: 11/11/2019 Delivering provider: MODY, VAISHALI   Admitting diagnosis: Normal labor [O80, Z37.9] Delivery by emergency cesarean [O99.892] Intrauterine pregnancy: [redacted]w[redacted]d     Secondary diagnosis: Patient Active Problem List   Diagnosis Date Noted  . Anxiety 11/14/2019  . S/P cesarean section: arrest of dilation and descent 11/12/2019  . Postpartum care following cesarean delivery (10/29) 11/12/2019  . Delivery by emergency cesarean 11/12/2019  . ALLERGIC RHINITIS 04/04/2009  . G E R D 04/04/2009  . WHEEZING 04/04/2009    Date of discharge: 11/14/2019   Discharge diagnosis: Principal Problem:   Postpartum care following cesarean delivery (10/29) Active Problems:   S/P cesarean section: arrest of dilation and descent   Delivery by emergency cesarean   Anxiety                                                          Post partum procedures:None  Augmentation: Pitocin Pain control: Epidural  Laceration:None  Episiotomy:None  Complications: None  Hospital course:  Onset of Labor With Unplanned C/S   34 y.o. yo G2P1011 at [redacted]w[redacted]d was admitted in Active Labor on 11/11/2019. Patient had a labor course significant for arrest of dilatation at 9cm. The patient went for cesarean section due to Arrest of Dilation and Arrest of Descent. Delivery details as follows: Membrane Rupture Time/Date: 12:41 PM ,11/11/2019   Delivery Method:C-Section, Low Transverse  Details of operation can be found in separate operative note. Patient had an uncomplicated postpartum course.  She is ambulating,tolerating a regular diet, passing flatus, and urinating well.  Patient is discharged home in stable condition 11/14/19.  Newborn Data: Birth date:11/11/2019  Birth time:11:05 PM  Gender:Female  Living status:Living  Apgars:9 ,9  Weight:3390 g   Physical exam  Vitals:   11/12/19 2012 11/13/19 0817  11/13/19 2113 11/14/19 0532  BP: 107/64 114/86 115/74 101/73  Pulse: 91 97 (!) 102 100  Resp:  20 20 16   Temp: 97.8 F (36.6 C) 98.7 F (37.1 C) 98.3 F (36.8 C) 98.2 F (36.8 C)  TempSrc:  Oral    SpO2:  98%    Weight:      Height:       General: alert, cooperative and no distress Lochia: appropriate Uterine Fundus: firm Incision: Healing well with no significant drainage, Dressing is clean, dry, and intact DVT Evaluation: No evidence of DVT seen on physical exam. No significant calf/ankle edema. Labs: Lab Results  Component Value Date   WBC 11.5 (H) 11/12/2019   HGB 10.1 (L) 11/12/2019   HCT 31.8 (L) 11/12/2019   MCV 85.7 11/12/2019   PLT 188 11/12/2019   CMP Latest Ref Rng & Units 04/16/2019  Glucose 70 - 99 mg/dL 89  BUN 6 - 20 mg/dL 6  Creatinine 06/16/2019 - 8.41 mg/dL 6.60  Sodium 6.30 - 160 mmol/L 137  Potassium 3.5 - 5.1 mmol/L 4.1  Chloride 98 - 111 mmol/L 102  CO2 22 - 32 mmol/L 25  Calcium 8.9 - 10.3 mg/dL 9.5  Total Protein 6.5 - 8.1 g/dL 7.0  Total Bilirubin 0.3 - 1.2 mg/dL 0.3  Alkaline Phos 38 - 126 U/L 40  AST 15 - 41 U/L 21  ALT 0 - 44 U/L 27   Edinburgh Postnatal  Depression Scale Screening Tool 11/12/2019  I have been able to laugh and see the funny side of things. (No Data)    Vaccines: TDaP UTD         Flu    Offered at discharge         COVID-19   UTD  Discharge instructions:  per After Visit Summary and [facility] OB booklet  After Visit Meds:  Allergies as of 11/14/2019      Reactions   Ibuprofen Hives      Medication List    TAKE these medications   acetaminophen 500 MG tablet Commonly known as: TYLENOL Take 2 tablets (1,000 mg total) by mouth every 6 (six) hours as needed for mild pain or moderate pain (first line).   cetirizine 10 MG tablet Commonly known as: ZYRTEC Take 10 mg by mouth daily.   coconut oil Oil Apply 1 application topically as needed.   famotidine 40 MG tablet Commonly known as: PEPCID Take 40 mg by mouth  daily.   multivitamin-prenatal 27-0.8 MG Tabs tablet Take 1 tablet by mouth daily at 12 noon.   oxyCODONE 5 MG immediate release tablet Commonly known as: Oxy IR/ROXICODONE Take 1 tablet (5 mg total) by mouth every 6 (six) hours as needed for moderate pain.   sertraline 25 MG tablet Commonly known as: ZOLOFT Take 25 mg by mouth daily.      Diet: routine diet  Activity: Advance as tolerated. Pelvic rest for 6 weeks.   Newborn Data: Live born female  Birth Weight: 7 lb 7.6 oz (3390 g) APGAR: 9, 9  Newborn Delivery   Birth date/time: 11/11/2019 23:05:00 Delivery type: C-Section, Low Transverse Trial of labor: Yes C-section categorization: Primary       Named Alexandra Baby Feeding: Breast Disposition:home with mother  Delivery Report:  Review the Delivery Report for details.    Follow up:  Follow-up Information    Noland Fordyce, MD. Schedule an appointment as soon as possible for a visit in 6 week(s).   Specialty: Obstetrics and Gynecology Why: Please make an appointment for 6 weeks postpartum.  Contact information: [address] [place] [address] [phone]               June Leap, CNM, MSN 11/14/2019, 12:12 PM

## 2019-11-14 NOTE — Lactation Note (Signed)
This note was copied from a baby's chart. Lactation Consultation Note  Patient Name: Sydney Gonzalez [id number] Date: 11/14/2019 Reason for consult: Follow-up assessment   Baby 59 hours old.  Mother is using #20NS to latch and is pumping occasionally. During consult mother had to go to bathroom.  LC will return to finish consult before discharge.   Maternal Data    Feeding Feeding Type: Breast Fed  LATCH Score                   Interventions    Lactation Tools Discussed/Used     Consult Status Consult Status: Follow-up Date: 11/14/19 Follow-up type: In-patient    Dahlia Byes [facility] 11/14/2019, 10:44 AM

## 2020-07-31 ENCOUNTER — Other Ambulatory Visit: Payer: Self-pay

## 2020-07-31 ENCOUNTER — Emergency Department (HOSPITAL_BASED_OUTPATIENT_CLINIC_OR_DEPARTMENT_OTHER): Payer: 59

## 2020-07-31 ENCOUNTER — Inpatient Hospital Stay (HOSPITAL_BASED_OUTPATIENT_CLINIC_OR_DEPARTMENT_OTHER)
Admission: EM | Admit: 2020-07-31 | Discharge: 2020-08-04 | DRG: 418 | Disposition: A | Discharge status: Home / Self Care | Payer: 59 | Attending: Student | Admitting: Student

## 2020-07-31 ENCOUNTER — Encounter (HOSPITAL_BASED_OUTPATIENT_CLINIC_OR_DEPARTMENT_OTHER): Payer: Self-pay | Admitting: *Deleted

## 2020-07-31 DIAGNOSIS — K8064 Calculus of gallbladder and bile duct with chronic cholecystitis without obstruction: Principal | ICD-10-CM | POA: Diagnosis present

## 2020-07-31 DIAGNOSIS — R933 Abnormal findings on diagnostic imaging of other parts of digestive tract: Secondary | ICD-10-CM

## 2020-07-31 DIAGNOSIS — F4322 Adjustment disorder with anxiety: Secondary | ICD-10-CM | POA: Diagnosis present

## 2020-07-31 DIAGNOSIS — Z8249 Family history of ischemic heart disease and other diseases of the circulatory system: Secondary | ICD-10-CM

## 2020-07-31 DIAGNOSIS — Z6841 Body Mass Index (BMI) 40.0 and over, adult: Secondary | ICD-10-CM

## 2020-07-31 DIAGNOSIS — K810 Acute cholecystitis: Secondary | ICD-10-CM | POA: Diagnosis present

## 2020-07-31 DIAGNOSIS — Z823 Family history of stroke: Secondary | ICD-10-CM

## 2020-07-31 DIAGNOSIS — R1011 Right upper quadrant pain: Secondary | ICD-10-CM | POA: Diagnosis not present

## 2020-07-31 DIAGNOSIS — Z886 Allergy status to analgesic agent status: Secondary | ICD-10-CM

## 2020-07-31 DIAGNOSIS — K8020 Calculus of gallbladder without cholecystitis without obstruction: Secondary | ICD-10-CM

## 2020-07-31 DIAGNOSIS — Z20822 Contact with and (suspected) exposure to covid-19: Secondary | ICD-10-CM | POA: Diagnosis present

## 2020-07-31 DIAGNOSIS — F32A Depression, unspecified: Secondary | ICD-10-CM | POA: Diagnosis present

## 2020-07-31 DIAGNOSIS — Z8041 Family history of malignant neoplasm of ovary: Secondary | ICD-10-CM

## 2020-07-31 DIAGNOSIS — Z79899 Other long term (current) drug therapy: Secondary | ICD-10-CM

## 2020-07-31 DIAGNOSIS — K760 Fatty (change of) liver, not elsewhere classified: Secondary | ICD-10-CM | POA: Diagnosis present

## 2020-07-31 LAB — URINALYSIS, ROUTINE W REFLEX MICROSCOPIC
Glucose, UA: NEGATIVE mg/dL
Ketones, ur: NEGATIVE mg/dL
Leukocytes,Ua: NEGATIVE
Nitrite: NEGATIVE
Specific Gravity, Urine: 1.035 — ABNORMAL HIGH (ref 1.005–1.030)
pH: 6.5 (ref 5.0–8.0)

## 2020-07-31 LAB — CBC
HCT: 43 % (ref 36.0–46.0)
Hemoglobin: 14 g/dL (ref 12.0–15.0)
MCH: 28.5 pg (ref 26.0–34.0)
MCHC: 32.6 g/dL (ref 30.0–36.0)
MCV: 87.6 fL (ref 80.0–100.0)
Platelets: 329 10*3/uL (ref 150–400)
RBC: 4.91 MIL/uL (ref 3.87–5.11)
RDW: 14.3 % (ref 11.5–15.5)
WBC: 10 10*3/uL (ref 4.0–10.5)
nRBC: 0 % (ref 0.0–0.2)

## 2020-07-31 LAB — COMPREHENSIVE METABOLIC PANEL
ALT: 265 U/L — ABNORMAL HIGH (ref 0–44)
AST: 347 U/L — ABNORMAL HIGH (ref 15–41)
Albumin: 4.5 g/dL (ref 3.5–5.0)
Alkaline Phosphatase: 63 U/L (ref 38–126)
Anion gap: 9 (ref 5–15)
BUN: 8 mg/dL (ref 6–20)
CO2: 27 mmol/L (ref 22–32)
Calcium: 9.2 mg/dL (ref 8.9–10.3)
Chloride: 106 mmol/L (ref 98–111)
Creatinine, Ser: 0.71 mg/dL (ref 0.44–1.00)
GFR, Estimated: >60 mL/min (ref >60)
Glucose, Bld: 116 mg/dL — ABNORMAL HIGH (ref 70–99)
Potassium: 4.1 mmol/L (ref 3.5–5.1)
Sodium: 142 mmol/L (ref 135–145)
Total Bilirubin: 2.7 mg/dL — ABNORMAL HIGH (ref 0.3–1.2)
Total Protein: 7.4 g/dL (ref 6.5–8.1)

## 2020-07-31 LAB — LIPASE, BLOOD: Lipase: 23 U/L (ref 11–51)

## 2020-07-31 LAB — PREGNANCY, URINE: Preg Test, Ur: NEGATIVE

## 2020-07-31 MED ORDER — ONDANSETRON HCL 4 MG/2ML IJ SOLN
4.0000 mg | Freq: Once | INTRAMUSCULAR | Status: DC
  Filled 2020-07-31: qty 2

## 2020-07-31 MED ORDER — MORPHINE SULFATE (PF) 4 MG/ML IV SOLN
4.0000 mg | Freq: Once | INTRAVENOUS | Status: AC
  Administered 2020-07-31: 4 mg via INTRAVENOUS
  Filled 2020-07-31: qty 1

## 2020-07-31 MED ORDER — SODIUM CHLORIDE 0.9 % IV BOLUS
1000.0000 mL | Freq: Once | INTRAVENOUS | Status: AC
  Administered 2020-07-31: 1000 mL via INTRAVENOUS

## 2020-07-31 NOTE — ED Provider Notes (Addendum)
[facility] EMERGENCY DEPT Provider Note   CSN: [id number] Arrival date & time: 07/31/20  1639     History Chief Complaint  Patient presents with   Abdominal Pain    Sydney Gonzalez is a 35 y.o. female.  HPI  35 year old female with past medical history of anxiety presents the emergency department right upper quadrant abdominal pain.  Patient states that this started this past week, has been intermittent, worsening overnight and more persistent today.  She is had associated nausea and more frequent bowel movements.  She has had chills at home but denies any fever.  No history of gallbladder problems in the past.  Denies any chest pain, cough or shortness of breath.  Past Medical History:  Diagnosis Date   Anxiety    Depression    Situational    Patient Active Problem List   Diagnosis Date Noted   Anxiety 11/14/2019   S/P cesarean section: arrest of dilation and descent 11/12/2019   Postpartum care following cesarean delivery (10/29) 11/12/2019   Delivery by emergency cesarean 11/12/2019   ALLERGIC RHINITIS 04/04/2009   G E R D 04/04/2009   WHEEZING 04/04/2009    Past Surgical History:  Procedure Laterality Date   CESAREAN SECTION N/A 11/11/2019   Procedure: CESAREAN SECTION;  Surgeon: Shea Evans, MD;  Location: [facility] LD ORS;  Service: Obstetrics;  Laterality: N/A;  Arrest of Descent   FRACTURE SURGERY     Right leg   MOUTH SURGERY       OB History     Gravida  2   Para  1   Term  1   Preterm      AB  1   Living  1      SAB  1   IAB      Ectopic      Multiple  0   Live Births  1           Family History  Problem Relation Age of Onset   Cancer Mother        Ovarian   Stroke Mother        Ocular   Heart defect Father     Social History   Tobacco Use   Smoking status: Never   Smokeless tobacco: Never  Vaping Use   Vaping Use: Never used  Substance Use Topics   Alcohol use: Not Currently    Comment: Social    Drug use: Not Currently    Types: Marijuana    Home Medications Prior to Admission medications   Medication Sig Start Date End Date Taking? Authorizing Provider  cetirizine (ZYRTEC) 10 MG tablet Take 10 mg by mouth daily.   Yes [provider]  Prenatal Vit-Fe Fumarate-FA (MULTIVITAMIN-PRENATAL) 27-0.8 MG TABS tablet Take 1 tablet by mouth daily at 12 noon.   Yes [provider]  sertraline (ZOLOFT) 25 MG tablet Take 25 mg by mouth daily.   Yes [provider]    Allergies    Ibuprofen  Review of Systems   Review of Systems  Constitutional:  Negative for chills and fever.  HENT:  Negative for congestion.   Respiratory:  Negative for shortness of breath.   Cardiovascular:  Negative for chest pain.  Gastrointestinal:  Positive for abdominal pain and nausea. Negative for blood in stool, diarrhea and vomiting.  Genitourinary:  Negative for dysuria and pelvic pain.  Musculoskeletal:  Negative for back pain.  Skin:  Negative for rash.  Neurological:  Negative  for headaches.   Physical Exam Updated Vital Signs BP (!) 139/95 (BP Location: Left Arm)   Pulse 73   Temp 98.1 F (36.7 C) (Oral)   Resp 19   Ht 5\' 6"  (1.676 m)   Wt 121.6 kg   LMP 06/13/2020   SpO2 98%   BMI 43.26 kg/m   Physical Exam Vitals and nursing note reviewed.  Constitutional:      Appearance: Normal appearance. She is obese.  HENT:     Head: Normocephalic.     Mouth/Throat:     Mouth: Mucous membranes are moist.  Cardiovascular:     Rate and Rhythm: Normal rate.  Pulmonary:     Effort: Pulmonary effort is normal. No respiratory distress.  Abdominal:     Palpations: Abdomen is soft.     Tenderness: There is abdominal tenderness in the right upper quadrant. There is guarding. Positive signs include Murphy's sign.  Skin:    General: Skin is warm.  Neurological:     Mental Status: She is alert and oriented to person, place, and time. Mental status is at baseline.   Psychiatric:        Mood and Affect: Mood normal.    ED Results / Procedures / Treatments   Labs (all labs ordered are listed, but only abnormal results are displayed) Labs Reviewed  COMPREHENSIVE METABOLIC PANEL - Abnormal; Notable for the following components:      Result Value   Glucose, Bld 116 (*)    AST 347 (*)    ALT 265 (*)    Total Bilirubin 2.7 (*)    All other components within normal limits  URINALYSIS, ROUTINE W REFLEX MICROSCOPIC - Abnormal; Notable for the following components:   Specific Gravity, Urine 1.035 (*)    Hgb urine dipstick SMALL (*)    Bilirubin Urine MODERATE (*)    Protein, ur TRACE (*)    All other components within normal limits  LIPASE, BLOOD  CBC  PREGNANCY, URINE    EKG None  Radiology No results found.  Procedures Procedures   Medications Ordered in ED Medications  ondansetron (ZOFRAN) injection 4 mg (4 mg Intravenous Not Given 07/31/20 2248)  morphine 4 MG/ML injection 4 mg (4 mg Intravenous Given 07/31/20 2242)  sodium chloride 0.9 % bolus 1,000 mL (1,000 mLs Intravenous New Bag/Given 07/31/20 2242)    ED Course  I have reviewed the triage vital signs and the nursing notes.  Pertinent labs & imaging results that were available during my care of the patient were reviewed by me and considered in my medical decision making (see chart for details).    MDM Rules/Calculators/A&P                           35 year old female presents emergency department right upper quadrant abdominal pain.  Vitals are stable on arrival.  She is had associated nausea/vomiting and more frequent bowel movements.  Patient is tender on exam with positive Murphy sign, abdomen is otherwise soft and nondistended.  Blood work is reassuring except for new transaminitis and elevated bilirubin.  High suspicion for gallbladder disease, patient is pending ultrasound.  Ultrasound shows cholelithiasis with biliary duct dilation.  ERCP/MRCP is recommended for  further evaluation.  Plan for admission for further evaluation and care.  Patients evaluation and results requires admission for further treatment and care. Patient agrees with admission plan, offers no new complaints and is stable/unchanged at time of admit.  Final  Clinical Impression(s) / ED Diagnoses Final diagnoses:  RUQ abdominal pain    Rx / DC Orders ED Discharge Orders     None        Rozelle Logan, DO 07/31/20 2301    Rozelle Logan, DO 07/31/20 2356

## 2020-07-31 NOTE — ED Triage Notes (Signed)
Intermittent RUQ pain for 2 weeks.

## 2020-08-01 ENCOUNTER — Inpatient Hospital Stay (HOSPITAL_COMMUNITY): Payer: 59

## 2020-08-01 DIAGNOSIS — K8064 Calculus of gallbladder and bile duct with chronic cholecystitis without obstruction: Secondary | ICD-10-CM | POA: Diagnosis present

## 2020-08-01 DIAGNOSIS — Z20822 Contact with and (suspected) exposure to covid-19: Secondary | ICD-10-CM | POA: Diagnosis present

## 2020-08-01 DIAGNOSIS — Z823 Family history of stroke: Secondary | ICD-10-CM | POA: Diagnosis not present

## 2020-08-01 DIAGNOSIS — K8051 Calculus of bile duct without cholangitis or cholecystitis with obstruction: Secondary | ICD-10-CM | POA: Diagnosis not present

## 2020-08-01 DIAGNOSIS — R748 Abnormal levels of other serum enzymes: Secondary | ICD-10-CM | POA: Diagnosis not present

## 2020-08-01 DIAGNOSIS — R1011 Right upper quadrant pain: Secondary | ICD-10-CM

## 2020-08-01 DIAGNOSIS — K8021 Calculus of gallbladder without cholecystitis with obstruction: Secondary | ICD-10-CM | POA: Diagnosis not present

## 2020-08-01 DIAGNOSIS — K810 Acute cholecystitis: Secondary | ICD-10-CM | POA: Diagnosis present

## 2020-08-01 DIAGNOSIS — Z8249 Family history of ischemic heart disease and other diseases of the circulatory system: Secondary | ICD-10-CM | POA: Diagnosis not present

## 2020-08-01 DIAGNOSIS — Z886 Allergy status to analgesic agent status: Secondary | ICD-10-CM | POA: Diagnosis not present

## 2020-08-01 DIAGNOSIS — F4322 Adjustment disorder with anxiety: Secondary | ICD-10-CM | POA: Diagnosis present

## 2020-08-01 DIAGNOSIS — Z6841 Body Mass Index (BMI) 40.0 and over, adult: Secondary | ICD-10-CM | POA: Diagnosis not present

## 2020-08-01 DIAGNOSIS — Z8041 Family history of malignant neoplasm of ovary: Secondary | ICD-10-CM | POA: Diagnosis not present

## 2020-08-01 DIAGNOSIS — F32A Depression, unspecified: Secondary | ICD-10-CM | POA: Diagnosis present

## 2020-08-01 DIAGNOSIS — Z79899 Other long term (current) drug therapy: Secondary | ICD-10-CM | POA: Diagnosis not present

## 2020-08-01 DIAGNOSIS — K760 Fatty (change of) liver, not elsewhere classified: Secondary | ICD-10-CM | POA: Diagnosis present

## 2020-08-01 DIAGNOSIS — F419 Anxiety disorder, unspecified: Secondary | ICD-10-CM | POA: Diagnosis not present

## 2020-08-01 LAB — COMPREHENSIVE METABOLIC PANEL
ALT: 557 U/L — ABNORMAL HIGH (ref 0–44)
AST: 573 U/L — ABNORMAL HIGH (ref 15–41)
Albumin: 4.4 g/dL (ref 3.5–5.0)
Alkaline Phosphatase: 105 U/L (ref 38–126)
Anion gap: 14 (ref 5–15)
BUN: 9 mg/dL (ref 6–20)
CO2: 24 mmol/L (ref 22–32)
Calcium: 9.4 mg/dL (ref 8.9–10.3)
Chloride: 105 mmol/L (ref 98–111)
Creatinine, Ser: 0.58 mg/dL (ref 0.44–1.00)
GFR, Estimated: >60 mL/min (ref >60)
Glucose, Bld: 88 mg/dL (ref 70–99)
Potassium: 3.9 mmol/L (ref 3.5–5.1)
Sodium: 143 mmol/L (ref 135–145)
Total Bilirubin: 4.8 mg/dL — ABNORMAL HIGH (ref 0.3–1.2)
Total Protein: 8 g/dL (ref 6.5–8.1)

## 2020-08-01 LAB — CBC
HCT: 44.4 % (ref 36.0–46.0)
Hemoglobin: 14.5 g/dL (ref 12.0–15.0)
MCH: 29.1 pg (ref 26.0–34.0)
MCHC: 32.7 g/dL (ref 30.0–36.0)
MCV: 89.2 fL (ref 80.0–100.0)
Platelets: 291 10*3/uL (ref 150–400)
RBC: 4.98 MIL/uL (ref 3.87–5.11)
RDW: 14.4 % (ref 11.5–15.5)
WBC: 5.6 10*3/uL (ref 4.0–10.5)
nRBC: 0 % (ref 0.0–0.2)

## 2020-08-01 LAB — RESP PANEL BY RT-PCR (FLU A&B, COVID) ARPGX2
Influenza A by PCR: NEGATIVE
Influenza B by PCR: NEGATIVE
SARS Coronavirus 2 by RT PCR: NEGATIVE

## 2020-08-01 LAB — HIV ANTIBODY (ROUTINE TESTING W REFLEX): HIV Screen 4th Generation wRfx: NONREACTIVE

## 2020-08-01 MED ORDER — SODIUM CHLORIDE 0.9 % IV SOLN: INTRAVENOUS | Status: DC

## 2020-08-01 MED ORDER — ONDANSETRON HCL 4 MG/2ML IJ SOLN
4.0000 mg | Freq: Four times a day (QID) | INTRAMUSCULAR | Status: DC | PRN
  Administered 2020-08-03: 4 mg via INTRAVENOUS
  Filled 2020-08-01: qty 2

## 2020-08-01 MED ORDER — MORPHINE SULFATE (PF) 2 MG/ML IV SOLN: 2.0000 mg | INTRAVENOUS | Status: DC | PRN

## 2020-08-01 MED ORDER — GADOBUTROL 1 MMOL/ML IV SOLN
10.0000 mL | Freq: Once | INTRAVENOUS | Status: AC | PRN
  Administered 2020-08-01: 10 mL via INTRAVENOUS

## 2020-08-01 MED ORDER — ONDANSETRON HCL 4 MG PO TABS: 4.0000 mg | ORAL_TABLET | Freq: Four times a day (QID) | ORAL | Status: DC | PRN

## 2020-08-01 MED ORDER — OXYCODONE HCL 5 MG PO TABS: 5.0000 mg | ORAL_TABLET | Freq: Four times a day (QID) | ORAL | Status: DC | PRN

## 2020-08-01 NOTE — ED Provider Notes (Signed)
Blood pressure 130/83, pulse 85, temperature 98.1 F (36.7 C), temperature source Oral, resp. rate 18, height 5\' 6"  (1.676 m), weight 121.6 kg, last menstrual period 06/13/2020, SpO2 98 %, unknown if currently breastfeeding.  Assuming care from Dr. 08/13/2020.  In short, Sydney Gonzalez is a 35 y.o. female with a chief complaint of Abdominal Pain .  Refer to the original H&P for additional details.  Discussed patient's case with [facility] to request admission. Patient and family (if present) updated with plan. Care transferred to [facility] service.  I reviewed all nursing notes, vitals, pertinent old records, EKGs, labs, imaging (as available).  Secure messaged Dr. [facility] for AM consultation after MRCP. Patient's pain is well controlled. No fever.    Orvan Falconer, MD 08/01/20 [phone]

## 2020-08-01 NOTE — Consult Note (Addendum)
Sydney Gonzalez 1985-06-14  [id number].    Requesting MD: Trey PaulaBradley Chotiner Chief Complaint: Intermittent RUQ pain now more persistent Reason for Consult: Cholelithiasis/choledocholithiais  HPI:  Patient is a 35 year old female who presented to the ED with complaints of abdominal pain on and off right upper quadrant over the last 2 weeks.  Yesterday [facility] with recurrent right upper quadrant pain and now with nausea.  She also reports more frequent bowel movements and chills at home but no fever.  The last meal she remembers is biscuit and gravy.  Patient is post partum status post C-section 11/11/2019.  Work-up shows she is afebrile vital signs are stable.  Admission labs shows an AST of 347>> 573, ALT 265>> 557, Total bilirubin 2.7>> 4.8.  WBC 10.0>> 5.6.  Urinalysis unremarkable, hCG is negative, respiratory panel is negative. Abdominal ultrasound yesterday shows multiple shadowing gallstones seen centrally.  The gallbladder was not distended.  There is no gallbladder wall thickening or pericholecystic fluid.  Positive Murphy sign on exam.  CBD was 9 mm.  There is also some moderate hepatic steatosis.  [facility] Bosie [facility]Schooler [facility] was [facility] MRCP is pending.  We are asked to see.   ROS: Review of Systems  Constitutional: Negative.   HENT:  Positive for nosebleeds.   Eyes: Negative.   Respiratory: Negative.    Cardiovascular: Negative.   Gastrointestinal:  Positive for abdominal pain and nausea (yesterday in ED waiting room). Negative for blood in stool, constipation, diarrhea and vomiting.  Genitourinary: Negative.   Musculoskeletal: Negative.   Skin: Negative.   Neurological: Negative.   Endo/Heme/Allergies: Negative.   Psychiatric/Behavioral:  Positive for depression. The patient is nervous/anxious.    Family History  Problem Relation Age of Onset   Cancer Mother        Ovarian   Stroke Mother         Ocular   Heart defect Father     Past Medical History:  Diagnosis Date   Anxiety    Depression    Situational    Past Surgical History:  Procedure Laterality Date   CESAREAN SECTION N/A 11/11/2019   Procedure: CESAREAN SECTION;  Surgeon: Shea EvansMody, Vaishali, MD;  Location: [facility] LD [facility]S;  Service: Obstetrics;  Laterality: N/A;  Arrest of Descent   FRACTURE SURGERY     Right leg   MOUTH SURGERY      Social History:  reports that she has never smoked. She has never used smokeless tobacco. She reports previous alcohol use. She reports previous drug use. Drug: Marijuana.  Allergies:  Allergies  Allergen Reactions   Ibuprofen Hives    Medications Prior to Admission  Medication Sig Dispense Refill   cetirizine (ZYRTEC) 10 MG tablet Take 10 mg by mouth daily.     Prenatal Vit-Fe Fumarate-FA (MULTIVITAMIN-PRENATAL) 27-0.8 MG TABS tablet Take 1 tablet by mouth daily at 12 noon.     sertraline (ZOLOFT) 25 MG tablet Take 25 mg by mouth daily.      Blood pressure (!) 141/81, pulse 86, temperature 97.7 F (36.5 C), temperature source Oral, resp. rate 18, height 5\' 6"  (1.676 m), weight 121.6 kg, last menstrual period 06/13/2020, SpO2 99 %, unknown if currently breastfeeding. Physical Exam:  General: pleasant, WD, WN white female who is laying in bed in NAD HEENT: head is normocephalic, atraumatic.  Sclera are noninjected.  PERRL.  Ears and nose without any masses or lesions.  Mouth is pink and  moist Heart: regular, rate, and rhythm.  Normal s1,s2. No obvious murmurs, gallops, or rubs noted.  Palpable radial and pedal pulses bilaterally Lungs: CTAB, no wheezes, rhonchi, or rales noted.  Respiratory effort nonlabored Abd: soft, NT this AM, ND, +BS, no masses, hernias, or organomegaly.  Pain was midabdomen going to RUQ, tender yesterday on admit, not tender now. MS: all 4 extremities are symmetrical with no cyanosis, clubbing, or edema. Skin: warm and dry with no masses, lesions, or rashes Neuro:  Cranial nerves 2-12 grossly intact, sensation is normal throughout Psych: A&Ox3 with an appropriate affect.   Results for orders placed or performed during the hospital encounter of 07/31/20 (from the past 48 hour(s))  Lipase, blood     Status: None   Collection Time: 07/31/20  5:35 PM  Result Value Ref Range   Lipase 23 11 - 51 U/L    Comment: Performed at [facility], [address], [place], [place] [address]  Comprehensive metabolic panel     Status: Abnormal   Collection Time: 07/31/20  5:35 PM  Result Value Ref Range   Sodium 142 135 - 145 mmol/L   Potassium 4.1 3.5 - 5.1 mmol/L   Chloride 106 98 - 111 mmol/L   CO2 27 22 - 32 mmol/L   Glucose, Bld 116 (H) 70 - 99 mg/dL    Comment: Glucose reference range applies only to samples taken after fasting for at least 8 hours.   BUN 8 6 - 20 mg/dL   Creatinine, Ser 9.47 0.44 - 1.00 mg/dL   Calcium 9.2 8.9 - 09.6 mg/dL   Total Protein 7.4 6.5 - 8.1 g/dL   Albumin 4.5 3.5 - 5.0 g/dL   AST 283 (H) 15 - 41 U/L   ALT 265 (H) 0 - 44 U/L   Alkaline Phosphatase 63 38 - 126 U/L   Total Bilirubin 2.7 (H) 0.3 - 1.2 mg/dL   GFR, Estimated >66 >29 mL/min    Comment: (NOTE) Calculated using the CKD-EPI Creatinine Equation (2021)    Anion gap 9 5 - 15    Comment: Performed at [facility], [address] [place], [place] [address]  CBC     Status: None   Collection Time: 07/31/20  5:35 PM  Result Value Ref Range   WBC 10.0 4.0 - 10.5 K/uL   RBC 4.91 3.87 - 5.11 MIL/uL   Hemoglobin 14.0 12.0 - 15.0 g/dL   HCT 65.0 35.4 - 65.6 %   MCV 87.6 80.0 - 100.0 fL   MCH 28.5 26.0 - 34.0 pg   MCHC 32.6 30.0 - 36.0 g/dL   RDW 81.2 75.1 - 70.0 %   Platelets 329 150 - 400 K/uL   nRBC 0.0 0.0 - 0.2 %    Comment: Performed at [facility], [address], [place], [place] [address]  Urinalysis, Routine w reflex microscopic Urine, Clean Catch     Status: Abnormal   Collection Time: 07/31/20   5:35 PM  Result Value Ref Range   Color, Urine YELLOW YELLOW   APPearance CLEAR CLEAR   Specific Gravity, Urine 1.035 (H) 1.005 - 1.030   pH 6.5 5.0 - 8.0   Glucose, UA NEGATIVE NEGATIVE mg/dL   Hgb urine dipstick SMALL (A) NEGATIVE   Bilirubin Urine MODERATE (A) NEGATIVE   Ketones, ur NEGATIVE NEGATIVE mg/dL   Protein, ur TRACE (A) NEGATIVE mg/dL   Nitrite NEGATIVE NEGATIVE   Leukocytes,Ua NEGATIVE NEGATIVE   RBC / HPF 0-5 0 -  5 RBC/hpf   WBC, UA 0-5 0 - 5 WBC/hpf   Squamous Epithelial / LPF 0-5 0 - 5   Mucus PRESENT     Comment: Performed at [facility], [address], [place], [place] [address]  Pregnancy, urine     Status: None   Collection Time: 07/31/20  5:35 PM  Result Value Ref Range   Preg Test, Ur NEGATIVE NEGATIVE    Comment:        THE SENSITIVITY OF THIS METHODOLOGY IS >20 mIU/mL. Performed at [facility], [address], [place], [place] [address]   Resp Panel by RT-PCR (Flu A&B, Covid) Nasopharyngeal Swab     Status: None   Collection Time: 08/01/20 12:19 AM   Specimen: Nasopharyngeal Swab; Nasopharyngeal(NP) swabs in vial transport medium  Result Value Ref Range   SARS Coronavirus 2 by RT PCR NEGATIVE NEGATIVE    Comment: (NOTE) SARS-CoV-2 target nucleic acids are NOT DETECTED.  The SARS-CoV-2 RNA is generally detectable in upper respiratory specimens during the acute phase of infection. The lowest concentration of SARS-CoV-2 viral copies this assay can detect is 138 copies/mL. A negative result does not preclude SARS-Cov-2 infection and should not be used as the sole basis for treatment or other patient management decisions. A negative result may occur with  improper specimen collection/handling, submission of specimen other than nasopharyngeal swab, presence of viral mutation(s) within the areas targeted by this assay, and inadequate number of viral copies(<138 copies/mL). A negative result must be combined  with clinical observations, patient history, and epidemiological information. The expected result is Negative.  Fact Sheet for Patients:  BloggerCourse.com  Fact Sheet for Healthcare Providers:  SeriousBroker.it  This test is no t yet approved or cleared by the [place] FDA and  has been authorized for detection and/or diagnosis of SARS-CoV-2 by FDA under an Emergency Use Authorization (EUA). This EUA will remain  in effect (meaning this test can be used) for the duration of the COVID-19 declaration under Section 564(b)(1) of the Act, 21 U.S.C.section 360bbb-3(b)(1), unless the authorization is terminated  or revoked sooner.       Influenza A by PCR NEGATIVE NEGATIVE   Influenza B by PCR NEGATIVE NEGATIVE    Comment: (NOTE) The Xpert Xpress SARS-CoV-2/FLU/RSV plus assay is intended as an aid in the diagnosis of influenza from Nasopharyngeal swab specimens and should not be used as a sole basis for treatment. Nasal washings and aspirates are unacceptable for Xpert Xpress SARS-CoV-2/FLU/RSV testing.  Fact Sheet for Patients: BloggerCourse.com  Fact Sheet for Healthcare Providers: SeriousBroker.it  This test is not yet approved or cleared by the [place] FDA and has been authorized for detection and/or diagnosis of SARS-CoV-2 by FDA under an Emergency Use Authorization (EUA). This EUA will remain in effect (meaning this test can be used) for the duration of the COVID-19 declaration under Section 564(b)(1) of the Act, 21 U.S.C. section 360bbb-3(b)(1), unless the authorization is terminated or revoked.  Performed at [facility], [address] [place], [place] [address]   CBC     Status: None   Collection Time: 08/01/20  9:56 AM  Result Value Ref Range   WBC 5.6 4.0 - 10.5 K/uL   RBC 4.98 3.87 - 5.11 MIL/uL   Hemoglobin 14.5 12.0 - 15.0 g/dL    HCT 69.6 29.5 - 28.4 %   MCV 89.2 80.0 - 100.0 fL   MCH 29.1 26.0 - 34.0 pg   MCHC 32.7 30.0 - 36.0  g/dL   RDW 35.0 09.3 - 81.8 %   Platelets 291 150 - 400 K/uL   nRBC 0.0 0.0 - 0.2 %    Comment: Performed at [facility], [address], [place], [place] [address]  Comprehensive metabolic panel     Status: Abnormal   Collection Time: 08/01/20  9:56 AM  Result Value Ref Range   Sodium 143 135 - 145 mmol/L   Potassium 3.9 3.5 - 5.1 mmol/L   Chloride 105 98 - 111 mmol/L   CO2 24 22 - 32 mmol/L   Glucose, Bld 88 70 - 99 mg/dL    Comment: Glucose reference range applies only to samples taken after fasting for at least 8 hours.   BUN 9 6 - 20 mg/dL   Creatinine, Ser 1.69 0.44 - 1.00 mg/dL   Calcium 9.4 8.9 - 67.8 mg/dL   Total Protein 8.0 6.5 - 8.1 g/dL   Albumin 4.4 3.5 - 5.0 g/dL   AST 938 (H) 15 - 41 U/L   ALT 557 (H) 0 - 44 U/L   Alkaline Phosphatase 105 38 - 126 U/L   Total Bilirubin 4.8 (H) 0.3 - 1.2 mg/dL   GFR, Estimated >10 >17 mL/min    Comment: (NOTE) Calculated using the CKD-EPI Creatinine Equation (2021)    Anion gap 14 5 - 15    Comment: Performed at [facility], [address], [place], [place] [address]   US Abdomen Limited RUQ (LIVER/GB)  Result Date: 07/31/2020 CLINICAL DATA:  Right upper quadrant abdominal pain EXAM: ULTRASOUND ABDOMEN LIMITED RIGHT UPPER QUADRANT COMPARISON:  None. FINDINGS: Gallbladder: Multiple shadowing gallstones are seen centrally. The gallbladder is not distended. There is no gallbladder wall thickening or pericholecystic fluid. The sonographic Eulah Pont sign is reportedly positive. Common bile duct: Diameter: The proximal extrahepatic bile duct is dilated measuring 9 mm in diameter, best seen on image # 31 Liver: The hepatic parenchyma is diffusely echogenic and there is poor acoustic through transmission in keeping with changes of at least moderate hepatic steatosis. This limits the sensitivity for  detection of focal intrahepatic masses though none are identified. There is no definite intrahepatic biliary ductal dilation. Portal vein is patent on color Doppler imaging with normal direction of blood flow towards the liver. Other: None. IMPRESSION: Cholelithiasis. Reportedly positive sonographic Eulah Pont sign is equivocal given the otherwise benign appearance of the gallbladder. Dilation of the extrahepatic bile duct. A distal obstructing lesion, such as choledocholithiasis, is not excluded. ERCP or MRCP examination would be helpful for further evaluation if there is clinical suspicion of a biliary obstructive process. Moderate hepatic steatosis. Electronically Signed   By: Helyn Numbers MD   On: 07/31/2020 23:30       Assessment/Plan   Abdominal pain with elevated LFTs Cholelithiasis/possible choledocholithiasis  - MRCP pending; repeat LFT's and lipase in AM.  IV fluids being started, we will follow with you.  I would keep her NPO after MN for possible procedure tomorrow.    FEN: N.p.o./ no IV fluids currently ordered ID: None DVT: SCD's added  Postpartum Hx anxiety/depression BMI 43.26   MRCP 7/20:  1. Cholelithiasis without evidence of gallbladder wall thickening. 2. Extrahepatic biliary dilatation has improved compared with recent ultrasound. This suggests possible interval passage of a common duct Kelley. There is a possible 2-3 mm residual [place] in the distal common bile duct. 3. No evidence of pancreatitis. 4. Hepatic steatosis.  Will recheck labs in AM and discuss with Dr. Carolynne Edouard  and await GI recommendations.      Sherrie George [facility] 08/01/2020, 10:37 AM Please see Amion for pager number during day hours 7:00am-4:30pm

## 2020-08-01 NOTE — Progress Notes (Signed)
Patient is talking on the phone,floor RN will re enter consult when patient is ready for IV start.

## 2020-08-01 NOTE — ED Notes (Signed)
Report called to Nicole with [facility]. 

## 2020-08-01 NOTE — ED Provider Notes (Signed)
  Physical Exam  BP (!) 98/55 (BP Location: Right Arm)   Pulse 77   Temp 98.3 F (36.8 C) (Oral)   Resp 15   Ht 5\' 6"  (1.676 m)   Wt 121.6 kg   LMP 06/13/2020   SpO2 96%   BMI 43.26 kg/m   Physical Exam  ED Course/Procedures     Procedures  MDM  Received care of patient from Dr.'s Long and Horton.  Brief is a 35 year old female has been awaiting admission for cholelithiasis, and suspected choledocholithiasis with biliary duct dilation and elevation in her transaminases.   Discussed this unassigned patient with [facility], [facility]. [facility], and plan on admission to [facility] at this time for further care. Her pain is controlled at this time and she is afebrile without concerns.    [facility], MD 08/01/20 [phone]

## 2020-08-01 NOTE — Consult Note (Signed)
Referring Provider: [facility] Primary Care Physician:  Patient, No Pcp Per (Inactive) Primary Gastroenterologist:  Gentry Fitz  Reason for Consultation:  Abnormal LFTs, dilated CBD on [place]  HPI: Amiera Herzberg is a 35 y.o. female with past medical history of C-section presenting for consultation of abnormal LFTs and dilated CBD on ultrasound.  She states she has had intermittent biliary symptoms for the past several months, though symptoms acutely worsened over the past 2 to 3 days.  She reports upper abdominal pain, worse in the right upper quadrant.  She has had some associated nausea but denies any vomiting.  She reports intermittent loose stools with urgency which is started over the same time period.  Denies melena or hematochezia.  Denies changes in appetite, unexplained weight loss, GERD, dysphagia.  Family history pertinent for mother with cholecystectomy.  No aspirin, NSAID, blood thinner use.  Past Medical History:  Diagnosis Date  . Anxiety   . Depression    Situational    Past Surgical History:  Procedure Laterality Date  . CESAREAN SECTION N/A 11/11/2019   Procedure: CESAREAN SECTION;  Surgeon: Shea Evans, MD;  Location: [facility] LD ORS;  Service: Obstetrics;  Laterality: N/A;  Arrest of Descent  . FRACTURE SURGERY     Right leg  . MOUTH SURGERY      Prior to Admission medications   Medication Sig Start Date End Date Taking? Authorizing Provider  cetirizine (ZYRTEC) 10 MG tablet Take 10 mg by mouth daily.   Yes [provider]  Prenatal Vit-Fe Fumarate-FA (MULTIVITAMIN-PRENATAL) 27-0.8 MG TABS tablet Take 1 tablet by mouth daily at 12 noon.   Yes [provider]  sertraline (ZOLOFT) 25 MG tablet Take 25 mg by mouth daily.   Yes [provider]    Scheduled Meds: . ondansetron (ZOFRAN) IV  4 mg Intravenous Once   Continuous Infusions: PRN Meds:.  Allergies as of 07/31/2020 - Review Complete 07/31/2020  Allergen Reaction Noted  . Ibuprofen  Hives 07/23/2017    Family History  Problem Relation Age of Onset  . Cancer Mother        Ovarian  . Stroke Mother        Ocular  . Heart defect Father     Social History   Socioeconomic History  . Marital status: Single    Spouse name: Not on file  . Number of children: Not on file  . Years of education: Not on file  . Highest education level: Not on file  Occupational History  . Not on file  Tobacco Use  . Smoking status: Never  . Smokeless tobacco: Never  Vaping Use  . Vaping Use: Never used  Substance and Sexual Activity  . Alcohol use: Not Currently    Comment: Social  . Drug use: Not Currently    Types: Marijuana  . Sexual activity: Yes    Birth control/protection: Implant  Other Topics Concern  . Not on file  Social History Narrative  . Not on file   Social Determinants of Health   Financial Resource Strain: Not on file  Food Insecurity: Not on file  Transportation Needs: Not on file  Physical Activity: Not on file  Stress: Not on file  Social Connections: Not on file  Intimate Partner Violence: Not on file    Review of Systems: Review of Systems  Constitutional:  Negative for chills, fever and weight loss.  HENT:  Negative for hearing loss and tinnitus.   Eyes:  Negative for pain and redness.  Respiratory:  Negative for cough and shortness of breath.   Cardiovascular:  Negative for chest pain and palpitations.  Gastrointestinal:  Positive for abdominal pain and nausea. Negative for blood in stool, constipation, diarrhea, heartburn, melena and vomiting.  Genitourinary:  Negative for flank pain and hematuria.  Musculoskeletal:  Negative for falls and joint pain.  Skin:  Negative for itching and rash.  Neurological:  Negative for seizures and loss of consciousness.  Psychiatric/Behavioral:  Negative for substance abuse. The patient is not nervous/anxious.    Physical Exam: Vital signs: Vitals:   08/01/20 0802 08/01/20 0912  BP: (!) 98/55 (!)  141/81  Pulse: 77 86  Resp: 15 18  Temp: 98.3 F (36.8 C) 97.7 F (36.5 C)  SpO2: 96% 99%   Last BM Date: 07/31/20  Physical Exam Vitals reviewed.  Constitutional:      General: She is not in acute distress. HENT:     Head: Normocephalic and atraumatic.     Nose: Nose normal. No congestion.     Mouth/Throat:     Mouth: Mucous membranes are moist.     Pharynx: Oropharynx is clear.  Eyes:     General: No scleral icterus.    Extraocular Movements: Extraocular movements intact.  Cardiovascular:     Rate and Rhythm: Normal rate and regular rhythm.     Heart sounds: Normal heart sounds.  Pulmonary:     Effort: Pulmonary effort is normal. No respiratory distress.  Abdominal:     General: Bowel sounds are normal. There is no distension.     Palpations: Abdomen is soft. There is no mass.     Tenderness: There is abdominal tenderness (RUQ). There is no guarding or rebound.     Hernia: No hernia is present.  Musculoskeletal:        General: No swelling or tenderness.     Cervical back: Normal range of motion and neck supple.  Skin:    General: Skin is warm and dry.  Neurological:     General: No focal deficit present.     Mental Status: She is alert and oriented to person, place, and time.  Psychiatric:        Mood and Affect: Mood normal.        Behavior: Behavior normal. Behavior is cooperative.    GI:  Lab Results: Recent Labs    07/31/20 1735  WBC 10.0  HGB 14.0  HCT 43.0  PLT 329   BMET Recent Labs    07/31/20 1735  NA 142  K 4.1  CL 106  CO2 27  GLUCOSE 116*  BUN 8  CREATININE 0.71  CALCIUM 9.2   LFT Recent Labs    07/31/20 1735  PROT 7.4  ALBUMIN 4.5  AST 347*  ALT 265*  ALKPHOS 63  BILITOT 2.7*   PT/INR No results for input(s): LABPROT, INR in the last 72 hours.   Studies/Results: US Abdomen Limited RUQ (LIVER/GB)  Result Date: 07/31/2020 CLINICAL DATA:  Right upper quadrant abdominal pain EXAM: ULTRASOUND ABDOMEN LIMITED RIGHT UPPER  QUADRANT COMPARISON:  None. FINDINGS: Gallbladder: Multiple shadowing gallstones are seen centrally. The gallbladder is not distended. There is no gallbladder wall thickening or pericholecystic fluid. The sonographic Eulah Pont sign is reportedly positive. Common bile duct: Diameter: The proximal extrahepatic bile duct is dilated measuring 9 mm in diameter, best seen on image # 31 Liver: The hepatic parenchyma is diffusely echogenic and there is poor acoustic through transmission in keeping with changes of at least moderate  hepatic steatosis. This limits the sensitivity for detection of focal intrahepatic masses though none are identified. There is no definite intrahepatic biliary ductal dilation. Portal vein is patent on color Doppler imaging with normal direction of blood flow towards the liver. Other: None. IMPRESSION: Cholelithiasis. Reportedly positive sonographic Eulah Pont sign is equivocal given the otherwise benign appearance of the gallbladder. Dilation of the extrahepatic bile duct. A distal obstructing lesion, such as choledocholithiasis, is not excluded. ERCP or MRCP examination would be helpful for further evaluation if there is clinical suspicion of a biliary obstructive process. Moderate hepatic steatosis. Electronically Signed   By: Helyn Numbers MD   On: 07/31/2020 23:30    Impression: Abnormal LFTs, dilated CBD on ultrasound, suspicious for choledocholithiasis.  Patient with symptomatic cholelithiasis. -T bili 4.8, increased from 2.7 yesterday -No leukocytosis -AST 573/ ALT 557/ ALP 105  Plan: Await MRI/MRCP.  If positive, proceed with ERCP tomorrow.  I thoroughly discussed the procedure with the patient to include nature, alternatives, benefits, and risks (including but not limited to post ERCP pancreatitis, bleeding, infection, perforation, anesthesia/cardiac and pulmonary complications).  Patient verbalized understanding gave verbal consent to proceed with ERCP.  After MRCP, clear liquid  diet OK, NPO at midnight.  CCS consulted for lap chole.  Continue to trend LFTs.  [facility] will follow.   LOS: 0 days   Edrick Kins  PA-C 08/01/2020, 9:39 AM  Contact #  [phone]

## 2020-08-01 NOTE — H&P (Signed)
History and Physical    Sydney Gonzalez UXN:[id number] DOB: 16-Oct-1985 DOA: 07/31/2020  PCP: Patient, No Pcp Per (Inactive)  Patient coming from: Home  Chief Complaint: Abdominal pain  HPI: Sydney Gonzalez is a 35 y.o. female with medical history significant of adjustment disorder w/ anxiety. Presents with 2 weeks of LUQ/RUQ abdominal pain. She reports that it initially started with 1 hour long episodes of bloating and burning. She noticed that deep breathing and eating made things worse. She tried occasional aleve, which would provide some relief. However over these two weeks, her pain has become more constant and intense. She ate lunch yesterday and her pain was quickly severe and she had some nausea. She decided to come to the ED for help. She denies any other aggravating or alleviating factors.  ED Course: She was found to have elevated LFTs. RUQ US showed dilation of CBD. [facility] and [facility] were consulted. [facility] was called for admission.   Review of Systems:  Denies CP, dyspnea, palpitations, diarrhea, vomiting, fever. Review of systems is otherwise negative for all not mentioned in HPI.   PMHx Past Medical History:  Diagnosis Date   Anxiety    Depression    Situational    PSHx Past Surgical History:  Procedure Laterality Date   CESAREAN SECTION N/A 11/11/2019   Procedure: CESAREAN SECTION;  Surgeon: Sydney Evans, MD;  Location: [facility] LD ORS;  Service: Obstetrics;  Laterality: N/A;  Arrest of Descent   FRACTURE SURGERY     Right leg   MOUTH SURGERY      SocHx  reports that she has never smoked. She has never used smokeless tobacco. She reports previous alcohol use. She reports previous drug use. Drug: Marijuana.  Allergies  Allergen Reactions   Ibuprofen Hives    FamHx Family History  Problem Relation Age of Onset   Cancer Mother        Ovarian   Stroke Mother        Ocular   Heart defect Father     Prior to Admission medications   Medication Sig Start  Date End Date Taking? Authorizing Provider  cetirizine (ZYRTEC) 10 MG tablet Take 10 mg by mouth daily.   Yes [provider]  Prenatal Vit-Fe Fumarate-FA (MULTIVITAMIN-PRENATAL) 27-0.8 MG TABS tablet Take 1 tablet by mouth daily at 12 noon.   Yes [provider]  sertraline (ZOLOFT) 25 MG tablet Take 25 mg by mouth daily.   Yes [provider]  norethindrone (MICRONOR) 0.35 MG tablet Take 0.35 mg by mouth daily. 06/22/20   [provider]  sertraline (ZOLOFT) 50 MG tablet Take 50 mg by mouth daily. 07/04/20   [provider]    Physical Exam: Vitals:   08/01/20 2542 08/01/20 0635 08/01/20 0802 08/01/20 0912  BP: 107/63 133/63 (!) 98/55 (!) 141/81  Pulse: 62 66 77 86  Resp: 20 18 15 18   Temp: 98.3 F (36.8 C)  98.3 F (36.8 C) 97.7 F (36.5 C)  TempSrc: Oral  Oral Oral  SpO2: 98% 98% 96% 99%  Weight:      Height:        General: 35 y.o. female resting in bed in NAD Eyes: PERRL, normal sclera ENMT: Nares patent w/o discharge, orophaynx clear, dentition normal, ears w/o discharge/lesions/ulcers Neck: Supple, trachea midline Cardiovascular: RRR, +S1, S2, no m/g/r, equal pulses throughout Respiratory: CTABL, no w/r/r, normal WOB GI: BS+, obese, soft, RUQ TTP, no masses noted, no organomegaly noted MSK: No e/c/c Skin:  No rashes, bruises, ulcerations noted Neuro: A&O x 3, no focal deficits Psyc: Appropriate interaction and affect, calm/cooperative  Labs on Admission: I have personally reviewed following labs and imaging studies  CBC: Recent Labs  Lab 07/31/20 1735 08/01/20 0956  WBC 10.0 5.6  HGB 14.0 14.5  HCT 43.0 44.4  MCV 87.6 89.2  PLT 329 291   Basic Metabolic Panel: Recent Labs  Lab 07/31/20 1735 08/01/20 0956  NA 142 143  K 4.1 3.9  CL 106 105  CO2 27 24  GLUCOSE 116* 88  BUN 8 9  CREATININE 0.71 0.58  CALCIUM 9.2 9.4   GFR: Estimated Creatinine Clearance: 130.5 mL/min (by C-G formula based on SCr of 0.58  mg/dL). Liver Function Tests: Recent Labs  Lab 07/31/20 1735 08/01/20 0956  AST 347* 573*  ALT 265* 557*  ALKPHOS 63 105  BILITOT 2.7* 4.8*  PROT 7.4 8.0  ALBUMIN 4.5 4.4   Recent Labs  Lab 07/31/20 1735  LIPASE 23   No results for input(s): AMMONIA in the last 168 hours. Coagulation Profile: No results for input(s): INR, PROTIME in the last 168 hours. Cardiac Enzymes: No results for input(s): CKTOTAL, CKMB, CKMBINDEX, TROPONINI in the last 168 hours. BNP (last 3 results) No results for input(s): PROBNP in the last 8760 hours. HbA1C: No results for input(s): HGBA1C in the last 72 hours. CBG: No results for input(s): GLUCAP in the last 168 hours. Lipid Profile: No results for input(s): CHOL, HDL, LDLCALC, TRIG, CHOLHDL, LDLDIRECT in the last 72 hours. Thyroid Function Tests: No results for input(s): TSH, T4TOTAL, FREET4, T3FREE, THYROIDAB in the last 72 hours. Anemia Panel: No results for input(s): VITAMINB12, FOLATE, FERRITIN, TIBC, IRON, RETICCTPCT in the last 72 hours. Urine analysis:    Component Value Date/Time   COLORURINE YELLOW 07/31/2020 1735   APPEARANCEUR CLEAR 07/31/2020 1735   LABSPEC 1.035 (H) 07/31/2020 1735   PHURINE 6.5 07/31/2020 1735   GLUCOSEU NEGATIVE 07/31/2020 1735   HGBUR SMALL (A) 07/31/2020 1735   BILIRUBINUR MODERATE (A) 07/31/2020 1735   KETONESUR NEGATIVE 07/31/2020 1735   PROTEINUR TRACE (A) 07/31/2020 1735   NITRITE NEGATIVE 07/31/2020 1735   LEUKOCYTESUR NEGATIVE 07/31/2020 1735    Radiological Exams on Admission: US Abdomen Limited RUQ (LIVER/GB)  Result Date: 07/31/2020 CLINICAL DATA:  Right upper quadrant abdominal pain EXAM: ULTRASOUND ABDOMEN LIMITED RIGHT UPPER QUADRANT COMPARISON:  None. FINDINGS: Gallbladder: Multiple shadowing gallstones are seen centrally. The gallbladder is not distended. There is no gallbladder wall thickening or pericholecystic fluid. The sonographic Eulah Pont sign is reportedly positive. Common bile duct:  Diameter: The proximal extrahepatic bile duct is dilated measuring 9 mm in diameter, best seen on image # 31 Liver: The hepatic parenchyma is diffusely echogenic and there is poor acoustic through transmission in keeping with changes of at least moderate hepatic steatosis. This limits the sensitivity for detection of focal intrahepatic masses though none are identified. There is no definite intrahepatic biliary ductal dilation. Portal vein is patent on color Doppler imaging with normal direction of blood flow towards the liver. Other: None. IMPRESSION: Cholelithiasis. Reportedly positive sonographic Eulah Pont sign is equivocal given the otherwise benign appearance of the gallbladder. Dilation of the extrahepatic bile duct. A distal obstructing lesion, such as choledocholithiasis, is not excluded. ERCP or MRCP examination would be helpful for further evaluation if there is clinical suspicion of a biliary obstructive process. Moderate hepatic steatosis. Electronically Signed   By: Helyn Numbers MD   On: 07/31/2020 23:30    EKG:  None obtained in ED  Assessment/Plan RUQ/LUQ abdominal pain Elevated LFTs/bilirubin     - admitted to inpt, med-surg     - RUQ w/ CBD dilation     - [facility] consulted; appreciate assistance     - MRCP ordered     - pain control, anti-emetics, Npo until MRCP completed; can have CLD after     - fluids  Adjustment disorder w/ anxiety     - continue home anxiolytic  Morbid obesity     - counsel on diet/lifestyle changes  DVT prophylaxis: SCDs  Code Status: FULL  Family Communication: None at bedside  Consults called: [facility], General Surgery   Status is: Inpatient  Remains inpatient appropriate because:Inpatient level of care appropriate due to severity of illness  Dispo: The patient is from: Home              Anticipated d/c is to: Home              Patient currently is not medically stable to d/c.   Difficult to place patient No  Time spent coordinating  admission: 60 minutes  Charly Hunton A Nalla Purdy DO [facility]  If 7PM-7AM, please contact night-coverage [email or web address]  08/01/2020, 10:46 AM

## 2020-08-01 NOTE — ED Notes (Signed)
Report called to Dahlia Client, RN [address] at [facility].

## 2020-08-01 NOTE — Progress Notes (Signed)
RN attempted IV stick but was unable to get access. IV team consulted but patient refused IV placement due to her being on "an important phone call."  IV team unable to come back until after shift change, patient made aware but still refused.

## 2020-08-02 ENCOUNTER — Inpatient Hospital Stay (HOSPITAL_COMMUNITY): Payer: 59 | Admitting: Certified Registered Nurse Anesthetist

## 2020-08-02 ENCOUNTER — Inpatient Hospital Stay (HOSPITAL_COMMUNITY): Payer: 59

## 2020-08-02 ENCOUNTER — Encounter (HOSPITAL_COMMUNITY): Payer: Self-pay | Admitting: Family Medicine

## 2020-08-02 ENCOUNTER — Encounter (HOSPITAL_COMMUNITY)
Admission: EM | Disposition: A | Discharge status: Home / Self Care | Payer: Self-pay | Source: Home / Self Care | Attending: Student

## 2020-08-02 DIAGNOSIS — R748 Abnormal levels of other serum enzymes: Secondary | ICD-10-CM | POA: Diagnosis not present

## 2020-08-02 DIAGNOSIS — F419 Anxiety disorder, unspecified: Secondary | ICD-10-CM

## 2020-08-02 DIAGNOSIS — K8021 Calculus of gallbladder without cholecystitis with obstruction: Secondary | ICD-10-CM | POA: Diagnosis not present

## 2020-08-02 DIAGNOSIS — F32A Depression, unspecified: Secondary | ICD-10-CM

## 2020-08-02 HISTORY — PX: ERCP: SHX5425

## 2020-08-02 HISTORY — PX: SPHINCTEROTOMY: SHX5544

## 2020-08-02 LAB — COMPREHENSIVE METABOLIC PANEL
ALT: 382 U/L — ABNORMAL HIGH (ref 0–44)
AST: 203 U/L — ABNORMAL HIGH (ref 15–41)
Albumin: 3.8 g/dL (ref 3.5–5.0)
Alkaline Phosphatase: 97 U/L (ref 38–126)
Anion gap: 11 (ref 5–15)
BUN: 8 mg/dL (ref 6–20)
CO2: 21 mmol/L — ABNORMAL LOW (ref 22–32)
Calcium: 8.6 mg/dL — ABNORMAL LOW (ref 8.9–10.3)
Chloride: 109 mmol/L (ref 98–111)
Creatinine, Ser: 0.67 mg/dL (ref 0.44–1.00)
GFR, Estimated: >60 mL/min (ref >60)
Glucose, Bld: 87 mg/dL (ref 70–99)
Potassium: 3.7 mmol/L (ref 3.5–5.1)
Sodium: 141 mmol/L (ref 135–145)
Total Bilirubin: 1.6 mg/dL — ABNORMAL HIGH (ref 0.3–1.2)
Total Protein: 6.6 g/dL (ref 6.5–8.1)

## 2020-08-02 LAB — LIPASE, BLOOD: Lipase: 32 U/L (ref 11–51)

## 2020-08-02 SURGERY — ERCP, WITH INTERVENTION IF INDICATED
Anesthesia: General

## 2020-08-02 MED ORDER — MIDAZOLAM HCL 5 MG/5ML IJ SOLN
INTRAMUSCULAR | Status: DC | PRN
  Administered 2020-08-02: 2 mg via INTRAVENOUS

## 2020-08-02 MED ORDER — CIPROFLOXACIN IN D5W 400 MG/200ML IV SOLN
400.0000 mg | Freq: Once | INTRAVENOUS | Status: AC
  Administered 2020-08-02: 400 mg via INTRAVENOUS

## 2020-08-02 MED ORDER — INDOMETHACIN 50 MG RE SUPP
RECTAL | Status: AC
  Filled 2020-08-02: qty 2

## 2020-08-02 MED ORDER — LACTATED RINGERS IV SOLN: INTRAVENOUS | Status: DC

## 2020-08-02 MED ORDER — SODIUM CHLORIDE 0.9 % IV SOLN
INTRAVENOUS | Status: DC | PRN
  Administered 2020-08-02: 10 mL

## 2020-08-02 MED ORDER — SERTRALINE HCL 50 MG PO TABS
50.0000 mg | ORAL_TABLET | Freq: Every day | ORAL | Status: DC
  Administered 2020-08-02: 50 mg via ORAL
  Filled 2020-08-02 (×2): qty 1

## 2020-08-02 MED ORDER — DEXAMETHASONE SODIUM PHOSPHATE 10 MG/ML IJ SOLN
INTRAMUSCULAR | Status: DC | PRN
Start: 2020-08-02 — End: 2020-08-02
  Administered 2020-08-02: 10 mg via INTRAVENOUS

## 2020-08-02 MED ORDER — SODIUM CHLORIDE 0.9 % IV SOLN: INTRAVENOUS | Status: DC

## 2020-08-02 MED ORDER — ROCURONIUM BROMIDE 10 MG/ML (PF) SYRINGE
PREFILLED_SYRINGE | INTRAVENOUS | Status: DC | PRN
  Administered 2020-08-02: 50 mg via INTRAVENOUS

## 2020-08-02 MED ORDER — MIDAZOLAM HCL 2 MG/2ML IJ SOLN
INTRAMUSCULAR | Status: AC
  Filled 2020-08-02: qty 2

## 2020-08-02 MED ORDER — ONDANSETRON HCL 4 MG/2ML IJ SOLN
INTRAMUSCULAR | Status: DC | PRN
Start: 2020-08-02 — End: 2020-08-02
  Administered 2020-08-02: 4 mg via INTRAVENOUS

## 2020-08-02 MED ORDER — GLUCAGON HCL RDNA (DIAGNOSTIC) 1 MG IJ SOLR
INTRAMUSCULAR | Status: AC
  Filled 2020-08-02: qty 1

## 2020-08-02 MED ORDER — LORATADINE 10 MG PO TABS: 10.0000 mg | ORAL_TABLET | Freq: Every day | ORAL | Status: DC | PRN

## 2020-08-02 MED ORDER — CIPROFLOXACIN IN D5W 400 MG/200ML IV SOLN
INTRAVENOUS | Status: AC
  Filled 2020-08-02: qty 200

## 2020-08-02 MED ORDER — FENTANYL CITRATE (PF) 100 MCG/2ML IJ SOLN
INTRAMUSCULAR | Status: AC
  Filled 2020-08-02: qty 2

## 2020-08-02 MED ORDER — FENTANYL CITRATE (PF) 100 MCG/2ML IJ SOLN
INTRAMUSCULAR | Status: DC | PRN
  Administered 2020-08-02 (×2): 50 ug via INTRAVENOUS

## 2020-08-02 MED ORDER — SUGAMMADEX SODIUM 200 MG/2ML IV SOLN
INTRAVENOUS | Status: DC | PRN
  Administered 2020-08-02: 300 mg via INTRAVENOUS

## 2020-08-02 MED ORDER — SODIUM CHLORIDE 0.9 % IV SOLN
2.0000 g | INTRAVENOUS | Status: AC
  Administered 2020-08-03: 2 g via INTRAVENOUS
  Filled 2020-08-02 (×2): qty 20

## 2020-08-02 MED ORDER — ALBUTEROL SULFATE HFA 108 (90 BASE) MCG/ACT IN AERS
INHALATION_SPRAY | RESPIRATORY_TRACT | Status: AC
  Filled 2020-08-02: qty 6.7

## 2020-08-02 MED ORDER — PROPOFOL 10 MG/ML IV BOLUS
INTRAVENOUS | Status: AC
  Filled 2020-08-02: qty 20

## 2020-08-02 MED ORDER — INDOMETHACIN 50 MG RE SUPP: 100.0000 mg | Freq: Once | RECTAL | Status: DC

## 2020-08-02 MED ORDER — ALBUTEROL SULFATE HFA 108 (90 BASE) MCG/ACT IN AERS
INHALATION_SPRAY | RESPIRATORY_TRACT | Status: DC | PRN
  Administered 2020-08-02: 3 via RESPIRATORY_TRACT

## 2020-08-02 MED ORDER — LIDOCAINE 2% (20 MG/ML) 5 ML SYRINGE
INTRAMUSCULAR | Status: DC | PRN
  Administered 2020-08-02: 80 mg via INTRAVENOUS

## 2020-08-02 MED ORDER — PROPOFOL 10 MG/ML IV BOLUS
INTRAVENOUS | Status: DC | PRN
  Administered 2020-08-02: 180 mg via INTRAVENOUS

## 2020-08-02 NOTE — Progress Notes (Signed)
PROGRESS NOTE  Sydney Gonzalez WUJ:[id number] DOB: Sep 26, 1985   PCP: Patient, No Pcp Per (Inactive)  Patient is from: Home  DOA: 07/31/2020 LOS: 1  Chief complaints:  Chief Complaint  Patient presents with   Abdominal Pain     Brief Narrative / Interim history: 35 year old F with PMH of anxiety, depression, cesarean section and morbid obesity presenting with pain across upper abdomen and admitted for acute symptomatic cholelithiasis, extrahepatic biliary dilation with possible choledocholithiasis as noted on RUQ [place] and MRCP.  She underwent ERCP that showed papillary edema but no choledocholithiasis however time of ERCP.  She had a sphincterotomy.  Plan for lap chole on 7/22.  Subjective: Seen and examined earlier this morning after she returned from ERCP.  No complaints.  She denies nausea, vomiting or abdominal pain.  She denies chest pain or dyspnea.  She is asking if she could visit 65 month old baby at the hospital door.   Objective: Vitals:   08/02/20 0900 08/02/20 0939 08/02/20 0954 08/02/20 1111  BP: 125/72 (!) 117/54 122/81 130/76  Pulse: 82 84 83 80  Resp: Temp:  97.8 F (36.6 C) (!) 97.5 F (36.4 C) 98.5 F (36.9 C)  TempSrc:  Oral Oral Oral  SpO2: 98% 96% 95% 95%  Weight:      Height:        Intake/Output Summary (Last 24 hours) at 08/02/2020 1151 Last data filed at 08/02/2020 1000 Gross per 24 hour  Intake 2080.85 ml  Output 2200 ml  Net -119.15 ml   Filed Weights   07/31/20 1707 08/02/20 0704  Weight: 121.6 kg 121.6 kg    Examination:  GENERAL: No apparent distress.  Nontoxic. HEENT: MMM.  Vision and hearing grossly intact.  NECK: Supple.  No apparent JVD.  RESP:  No IWOB.  Fair aeration bilaterally. CVS:  RRR. Heart sounds normal.  ABD/GI/GU: BS+. Abd soft, NTND.  MSK/EXT:  Moves extremities. No apparent deformity. No edema.  SKIN: no apparent skin lesion or wound NEURO: Awake, alert and oriented appropriately.  No apparent  focal neuro deficit. PSYCH: Calm. Normal affect.   Procedures:  7/21-ERCP with sphincterectomy.  Microbiology summarized: COVID-19 and influenza PCR nonreactive.  Assessment & Plan: Symptomatic cholelithiasis with extrahepatic biliary dilation and possible choledocholithiasis -7/21-ERCP showed papillary edema but no Col[place].  She might have further Schlag -Plan for lap chole on 7/22 -Continue clear liquid diet  Abdominal pain/elevated liver enzymes/hyperbilirubinemia-likely due to the above.  Pain resolved.  LFT improved Recent Labs  Lab 07/31/20 1735 08/01/20 0956 08/02/20 0430  AST 347* 573* 203*  ALT 265* 557* 382*  ALKPHOS 63 105 97  BILITOT 2.7* 4.8* 1.6*  PROT 7.4 8.0 6.6  ALBUMIN 4.5 4.4 3.8  -Recheck LFT in the morning  Anxiety and depression: Stable -Continue Zoloft  Morbid obesity Body mass index is 43.26 kg/m.  -Encourage lifestyle change to lose weight       DVT prophylaxis:  SCDs Start: 08/01/20 1122 Place and maintain sequential compression device Start: 08/01/20 1104  Code Status: Full code Family Communication: Patient and/or RN.  Updated patient's husband at bedside Level of care: Med-Surg Status is: Inpatient  Remains inpatient appropriate because:Ongoing diagnostic testing needed not appropriate for outpatient work up  Dispo: The patient is from: Home              Anticipated d/c is to: Home              Patient currently is  not medically stable to d/c.   Difficult to place patient No       Consultants:  Gastroenterology General surgery   Sch Meds:  Scheduled Meds:  sertraline  50 mg Oral Daily   Continuous Infusions:  sodium chloride 75 mL/hr at 08/01/20 2203   lactated ringers 10 mL/hr at 08/02/20 0748   lactated ringers     PRN Meds:.loratadine, morphine injection, ondansetron **OR** ondansetron (ZOFRAN) IV, oxyCODONE  Antimicrobials: Anti-infectives (From admission, onward)    Start     Dose/Rate Route Frequency  Ordered Stop   08/02/20 0700  ciprofloxacin (CIPRO) IVPB 400 mg        400 mg 200 mL/hr over 60 Minutes Intravenous  Once 08/02/20 0653 08/02/20 0828        I have personally reviewed the following labs and images: CBC: Recent Labs  Lab 07/31/20 1735 08/01/20 0956  WBC 10.0 5.6  HGB 14.0 14.5  HCT 43.0 44.4  MCV 87.6 89.2  PLT 329 291   BMP &GFR Recent Labs  Lab 07/31/20 1735 08/01/20 0956 08/02/20 0430  NA 142 143 141  K 4.1 3.9 3.7  CL 106 105 109  CO2 27 24 21*  GLUCOSE 116* 88 87  BUN 8 9 8   CREATININE 0.71 0.58 0.67  CALCIUM 9.2 9.4 8.6*   Estimated Creatinine Clearance: 130.5 mL/min (by C-G formula based on SCr of 0.67 mg/dL). Liver & Pancreas: Recent Labs  Lab 07/31/20 1735 08/01/20 0956 08/02/20 0430  AST 347* 573* 203*  ALT 265* 557* 382*  ALKPHOS 63 105 97  BILITOT 2.7* 4.8* 1.6*  PROT 7.4 8.0 6.6  ALBUMIN 4.5 4.4 3.8   Recent Labs  Lab 07/31/20 1735 08/02/20 0430  LIPASE 23 32   No results for input(s): AMMONIA in the last 168 hours. Diabetic: No results for input(s): HGBA1C in the last 72 hours. No results for input(s): GLUCAP in the last 168 hours. Cardiac Enzymes: No results for input(s): CKTOTAL, CKMB, CKMBINDEX, TROPONINI in the last 168 hours. No results for input(s): PROBNP in the last 8760 hours. Coagulation Profile: No results for input(s): INR, PROTIME in the last 168 hours. Thyroid Function Tests: No results for input(s): TSH, T4TOTAL, FREET4, T3FREE, THYROIDAB in the last 72 hours. Lipid Profile: No results for input(s): CHOL, HDL, LDLCALC, TRIG, CHOLHDL, LDLDIRECT in the last 72 hours. Anemia Panel: No results for input(s): VITAMINB12, FOLATE, FERRITIN, TIBC, IRON, RETICCTPCT in the last 72 hours. Urine analysis:    Component Value Date/Time   COLORURINE YELLOW 07/31/2020 1735   APPEARANCEUR CLEAR 07/31/2020 1735   LABSPEC 1.035 (H) 07/31/2020 1735   PHURINE 6.5 07/31/2020 1735   GLUCOSEU NEGATIVE 07/31/2020 1735    HGBUR SMALL (A) 07/31/2020 1735   BILIRUBINUR MODERATE (A) 07/31/2020 1735   KETONESUR NEGATIVE 07/31/2020 1735   PROTEINUR TRACE (A) 07/31/2020 1735   NITRITE NEGATIVE 07/31/2020 1735   LEUKOCYTESUR NEGATIVE 07/31/2020 1735   Sepsis Labs: Invalid input(s): PROCALCITONIN, LACTICIDVEN  Microbiology: Recent Results (from the past 240 hour(s))  Resp Panel by RT-PCR (Flu A&B, Covid) Nasopharyngeal Swab     Status: None   Collection Time: 08/01/20 12:19 AM   Specimen: Nasopharyngeal Swab; Nasopharyngeal(NP) swabs in vial transport medium  Result Value Ref Range Status   SARS Coronavirus 2 by RT PCR NEGATIVE NEGATIVE Final    Comment: (NOTE) SARS-CoV-2 target nucleic acids are NOT DETECTED.  The SARS-CoV-2 RNA is generally detectable in upper respiratory specimens during the acute phase of infection. The lowest concentration  of SARS-CoV-2 viral copies this assay can detect is 138 copies/mL. A negative result does not preclude SARS-Cov-2 infection and should not be used as the sole basis for treatment or other patient management decisions. A negative result may occur with  improper specimen collection/handling, submission of specimen other than nasopharyngeal swab, presence of viral mutation(s) within the areas targeted by this assay, and inadequate number of viral copies(<138 copies/mL). A negative result must be combined with clinical observations, patient history, and epidemiological information. The expected result is Negative.  Fact Sheet for Patients:  BloggerCourse.com  Fact Sheet for Healthcare Providers:  SeriousBroker.it  This test is no t yet approved or cleared by the [place] FDA and  has been authorized for detection and/or diagnosis of SARS-CoV-2 by FDA under an Emergency Use Authorization (EUA). This EUA will remain  in effect (meaning this test can be used) for the duration of the COVID-19 declaration under  Section 564(b)(1) of the Act, 21 U.S.C.section 360bbb-3(b)(1), unless the authorization is terminated  or revoked sooner.       Influenza A by PCR NEGATIVE NEGATIVE Final   Influenza B by PCR NEGATIVE NEGATIVE Final    Comment: (NOTE) The Xpert Xpress SARS-CoV-2/FLU/RSV plus assay is intended as an aid in the diagnosis of influenza from Nasopharyngeal swab specimens and should not be used as a sole basis for treatment. Nasal washings and aspirates are unacceptable for Xpert Xpress SARS-CoV-2/FLU/RSV testing.  Fact Sheet for Patients: BloggerCourse.com  Fact Sheet for Healthcare Providers: SeriousBroker.it  This test is not yet approved or cleared by the [place] FDA and has been authorized for detection and/or diagnosis of SARS-CoV-2 by FDA under an Emergency Use Authorization (EUA). This EUA will remain in effect (meaning this test can be used) for the duration of the COVID-19 declaration under Section 564(b)(1) of the Act, 21 U.S.C. section 360bbb-3(b)(1), unless the authorization is terminated or revoked.  Performed at [facility], [address] [place], [place] [address]     Radiology Studies: MR 3D Recon At Scanner  Result Date: 08/01/2020 CLINICAL DATA:  Right upper quadrant abdominal pain and jaundice. Abnormal ultrasound. EXAM: MRI ABDOMEN WITHOUT AND WITH CONTRAST (INCLUDING MRCP) TECHNIQUE: Multiplanar multisequence MR imaging of the abdomen was performed both before and after the administration of intravenous contrast. Heavily T2-weighted images of the biliary and pancreatic ducts were obtained, and three-dimensional MRCP images were rendered by post processing. CONTRAST:  31mL GADAVIST GADOBUTROL 1 MMOL/ML IV SOLN COMPARISON:  Ultrasound 07/31/2020 FINDINGS: Despite efforts by the technologist and patient, mild motion artifact is present on today's exam and could not be eliminated. This  reduces exam sensitivity and specificity. Lower chest: Trace pleural effusions and mild atelectasis at both lung bases. Hepatobiliary: There is loss of signal in the liver on the gradient echo opposed phase images consistent with steatosis. No focal lesion or abnormal enhancement identified. As seen on ultrasound, there are multiple gallstones. No evidence of gallbladder wall thickening or surrounding inflammation. No significant residual intrahepatic or extrahepatic biliary dilatation. The common hepatic duct measures 6 mm in diameter (9 mm on ultrasound). Equivocal findings for a 2-3 mm [place]aut[place] in the distal common bile duct (best seen on image 20/8 and 1/14). Pancreas: Unremarkable. No pancreatic ductal dilatation or surrounding inflammatory changes. Spleen: Normal in size without focal abnormality. Adrenals/Urinary Tract: Both adrenal glands appear normal. Both kidneys appear normal without evidence of hydronephrosis or mass lesion. Stomach/Bowel: The stomach appears unremarkable for its degree of distension. No evidence of  bowel wall thickening, distention or surrounding inflammatory change. Vascular/Lymphatic: There are no enlarged abdominal lymph nodes. No significant vascular findings. Other: No evidence of abdominal wall hernia or ascites. Musculoskeletal: No acute or significant osseous findings. IMPRESSION: 1. Cholelithiasis without evidence of gallbladder wall thickening. 2. Extrahepatic biliary dilatation has improved compared with recent ultrasound. This suggests possible interval passage of a common duct N[place]. There is a possible 2-3 mm residual Rebert in the distal common bile duct. 3. No evidence of pancreatitis. 4. Hepatic steatosis. Electronically Signed   By: Carey BullocksWilliam  Veazey M.D.   On: 08/01/2020 14:29   DG ERCP BILIARY & PANCREATIC DUCTS  Result Date: 08/02/2020 CLINICAL DATA:  Cholelithiasis, concern for choledocholithiasis EXAM: ERCP with sphincterotomy and balloon sweep TECHNIQUE: Multiple  spot images obtained with the fluoroscopic device and submitted for interpretation post-procedure. FLUOROSCOPY TIME:  Fluoroscopy Time:  1 minutes 49 seconds Number of Acquired Spot Images: Several spot fluoroscopic views COMPARISON:  08/01/2020 MR FINDINGS: Limited spot fluoroscopic views during the ERCP demonstrate retrograde guidewire access and contrast injection into the biliary tree. Mild CBD dilatation. Balloon sweep performed. No large filling defects appreciated or obstruction pattern. Intrahepatic ducts appear nondilated. IMPRESSION: Successful ERCP with CBD balloon sweep. These images were submitted for radiologic interpretation only. Please see the procedural report for the amount of contrast and the fluoroscopy time utilized. Electronically Signed   By: Judie PetitM.  Shick M.D.   On: 08/02/2020 08:52   MR ABDOMEN MRCP W WO CONTAST  Result Date: 08/01/2020 CLINICAL DATA:  Right upper quadrant abdominal pain and jaundice. Abnormal ultrasound. EXAM: MRI ABDOMEN WITHOUT AND WITH CONTRAST (INCLUDING MRCP) TECHNIQUE: Multiplanar multisequence MR imaging of the abdomen was performed both before and after the administration of intravenous contrast. Heavily T2-weighted images of the biliary and pancreatic ducts were obtained, and three-dimensional MRCP images were rendered by post processing. CONTRAST:  10mL GADAVIST GADOBUTROL 1 MMOL/ML IV SOLN COMPARISON:  Ultrasound 07/31/2020 FINDINGS: Despite efforts by the technologist and patient, mild motion artifact is present on today's exam and could not be eliminated. This reduces exam sensitivity and specificity. Lower chest: Trace pleural effusions and mild atelectasis at both lung bases. Hepatobiliary: There is loss of signal in the liver on the gradient echo opposed phase images consistent with steatosis. No focal lesion or abnormal enhancement identified. As seen on ultrasound, there are multiple gallstones. No evidence of gallbladder wall thickening or surrounding  inflammation. No significant residual intrahepatic or extrahepatic biliary dilatation. The common hepatic duct measures 6 mm in diameter (9 mm on ultrasound). Equivocal findings for a 2-3 mm [place] in the distal common bile duct (best seen on image 20/8 and 1/14). Pancreas: Unremarkable. No pancreatic ductal dilatation or surrounding inflammatory changes. Spleen: Normal in size without focal abnormality. Adrenals/Urinary Tract: Both adrenal glands appear normal. Both kidneys appear normal without evidence of hydronephrosis or mass lesion. Stomach/Bowel: The stomach appears unremarkable for its degree of distension. No evidence of bowel wall thickening, distention or surrounding inflammatory change. Vascular/Lymphatic: There are no enlarged abdominal lymph nodes. No significant vascular findings. Other: No evidence of abdominal wall hernia or ascites. Musculoskeletal: No acute or significant osseous findings. IMPRESSION: 1. Cholelithiasis without evidence of gallbladder wall thickening. 2. Extrahepatic biliary dilatation has improved compared with recent ultrasound. This suggests possible interval passage of a common duct Archbold. There is a possible 2-3 mm residual Visscher in the distal common bile duct. 3. No evidence of pancreatitis. 4. Hepatic steatosis. Electronically Signed   By: Hilarie FredricksonWilliam  Veazey M.D.  On: 08/01/2020 14:29       Ariyah Sedlack T. Kenzy Campoverde [facility]  If 7PM-7AM, please contact night-coverage [email or web address] 08/02/2020, 11:51 AM

## 2020-08-02 NOTE — Plan of Care (Signed)

## 2020-08-02 NOTE — H&P (View-Only) (Signed)
Day of Surgery  Subjective: CC: Post ERCP. No abdominal pain, n/v. Tolerating cld.   Objective: Vital signs in last 24 hours: Temp:  [97.5 F (36.4 C)-99.3 F (37.4 C)] 98.5 F (36.9 C) (07/21 1111) Pulse Rate:  [72-89] 80 (07/21 1111) Resp:  [10-19] 18 (07/21 1111) BP: (114-138)/(54-92) 130/76 (07/21 1111) SpO2:  [95 %-100 %] 95 % (07/21 1111) Weight:  [121.6 kg] 121.6 kg (07/21 0704) Last BM Date: 07/31/20  Intake/Output from previous day: 07/20 0701 - 07/21 0700 In: 1320.9 [P.O.:240; I.V.:1080.9] Out: 2200 [Urine:2200] Intake/Output this shift: Total I/O In: 760 [P.O.:60; I.V.:500; IV Piggyback:200] Out: 0   PE: Gen:  Alert, NAD, pleasant Card:  RRR Pulm:  CTA b/l Abd: Soft, ND, NT +BS, Psych: A&Ox3  Skin: no rashes noted, warm and dry  Lab Results:  Recent Labs    07/31/20 1735 08/01/20 0956  WBC 10.0 5.6  HGB 14.0 14.5  HCT 43.0 44.4  PLT 329 291   BMET Recent Labs    08/01/20 0956 08/02/20 0430  NA 143 141  K 3.9 3.7  CL 105 109  CO2 24 21*  GLUCOSE 88 87  BUN 9 8  CREATININE 0.58 0.67  CALCIUM 9.4 8.6*   PT/INR No results for input(s): LABPROT, INR in the last 72 hours. CMP     Component Value Date/Time   NA 141 08/02/2020 0430   K 3.7 08/02/2020 0430   CL 109 08/02/2020 0430   CO2 21 (L) 08/02/2020 0430   GLUCOSE 87 08/02/2020 0430   BUN 8 08/02/2020 0430   CREATININE 0.67 08/02/2020 0430   CALCIUM 8.6 (L) 08/02/2020 0430   PROT 6.6 08/02/2020 0430   ALBUMIN 3.8 08/02/2020 0430   AST 203 (H) 08/02/2020 0430   ALT 382 (H) 08/02/2020 0430   ALKPHOS 97 08/02/2020 0430   BILITOT 1.6 (H) 08/02/2020 0430   GFRNONAA >60 08/02/2020 0430   GFRAA >60 04/16/2019 1434   Lipase     Component Value Date/Time   LIPASE 32 08/02/2020 0430    Studies/Results: MR 3D Recon At Scanner  Result Date: 08/01/2020 CLINICAL DATA:  Right upper quadrant abdominal pain and jaundice. Abnormal ultrasound. EXAM: MRI ABDOMEN WITHOUT AND WITH  CONTRAST (INCLUDING MRCP) TECHNIQUE: Multiplanar multisequence MR imaging of the abdomen was performed both before and after the administration of intravenous contrast. Heavily T2-weighted images of the biliary and pancreatic ducts were obtained, and three-dimensional MRCP images were rendered by post processing. CONTRAST:  62mL GADAVIST GADOBUTROL 1 MMOL/ML IV SOLN COMPARISON:  Ultrasound 07/31/2020 FINDINGS: Despite efforts by the technologist and patient, mild motion artifact is present on today's exam and could not be eliminated. This reduces exam sensitivity and specificity. Lower chest: Trace pleural effusions and mild atelectasis at both lung bases. Hepatobiliary: There is loss of signal in the liver on the gradient echo opposed phase images consistent with steatosis. No focal lesion or abnormal enhancement identified. As seen on ultrasound, there are multiple gallstones. No evidence of gallbladder wall thickening or surrounding inflammation. No significant residual intrahepatic or extrahepatic biliary dilatation. The common hepatic duct measures 6 mm in diameter (9 mm on ultrasound). Equivocal findings for a 2-3 mm Barnhart in the distal common bile duct (best seen on image 20/8 and 1/14). Pancreas: Unremarkable. No pancreatic ductal dilatation or surrounding inflammatory changes. Spleen: Normal in size without focal abnormality. Adrenals/Urinary Tract: Both adrenal glands appear normal. Both kidneys appear normal without evidence of hydronephrosis or mass lesion. Stomach/Bowel: The stomach appears  unremarkable for its degree of distension. No evidence of bowel wall thickening, distention or surrounding inflammatory change. Vascular/Lymphatic: There are no enlarged abdominal lymph nodes. No significant vascular findings. Other: No evidence of abdominal wall hernia or ascites. Musculoskeletal: No acute or significant osseous findings. IMPRESSION: 1. Cholelithiasis without evidence of gallbladder wall thickening.  2. Extrahepatic biliary dilatation has improved compared with recent ultrasound. This suggests possible interval passage of a common duct Speas. There is a possible 2-3 mm residual Holstine in the distal common bile duct. 3. No evidence of pancreatitis. 4. Hepatic steatosis. Electronically Signed   By: Carey Bullocks M.D.   On: 08/01/2020 14:29   DG ERCP BILIARY & PANCREATIC DUCTS  Result Date: 08/02/2020 CLINICAL DATA:  Cholelithiasis, concern for choledocholithiasis EXAM: ERCP with sphincterotomy and balloon sweep TECHNIQUE: Multiple spot images obtained with the fluoroscopic device and submitted for interpretation post-procedure. FLUOROSCOPY TIME:  Fluoroscopy Time:  1 minutes 49 seconds Number of Acquired Spot Images: Several spot fluoroscopic views COMPARISON:  08/01/2020 MR FINDINGS: Limited spot fluoroscopic views during the ERCP demonstrate retrograde guidewire access and contrast injection into the biliary tree. Mild CBD dilatation. Balloon sweep performed. No large filling defects appreciated or obstruction pattern. Intrahepatic ducts appear nondilated. IMPRESSION: Successful ERCP with CBD balloon sweep. These images were submitted for radiologic interpretation only. Please see the procedural report for the amount of contrast and the fluoroscopy time utilized. Electronically Signed   By: Judie Petit.  Shick M.D.   On: 08/02/2020 08:52   MR ABDOMEN MRCP W WO CONTAST  Result Date: 08/01/2020 CLINICAL DATA:  Right upper quadrant abdominal pain and jaundice. Abnormal ultrasound. EXAM: MRI ABDOMEN WITHOUT AND WITH CONTRAST (INCLUDING MRCP) TECHNIQUE: Multiplanar multisequence MR imaging of the abdomen was performed both before and after the administration of intravenous contrast. Heavily T2-weighted images of the biliary and pancreatic ducts were obtained, and three-dimensional MRCP images were rendered by post processing. CONTRAST:  21mL GADAVIST GADOBUTROL 1 MMOL/ML IV SOLN COMPARISON:  Ultrasound 07/31/2020  FINDINGS: Despite efforts by the technologist and patient, mild motion artifact is present on today's exam and could not be eliminated. This reduces exam sensitivity and specificity. Lower chest: Trace pleural effusions and mild atelectasis at both lung bases. Hepatobiliary: There is loss of signal in the liver on the gradient echo opposed phase images consistent with steatosis. No focal lesion or abnormal enhancement identified. As seen on ultrasound, there are multiple gallstones. No evidence of gallbladder wall thickening or surrounding inflammation. No significant residual intrahepatic or extrahepatic biliary dilatation. The common hepatic duct measures 6 mm in diameter (9 mm on ultrasound). Equivocal findings for a 2-3 mm Roza in the distal common bile duct (best seen on image 20/8 and 1/14). Pancreas: Unremarkable. No pancreatic ductal dilatation or surrounding inflammatory changes. Spleen: Normal in size without focal abnormality. Adrenals/Urinary Tract: Both adrenal glands appear normal. Both kidneys appear normal without evidence of hydronephrosis or mass lesion. Stomach/Bowel: The stomach appears unremarkable for its degree of distension. No evidence of bowel wall thickening, distention or surrounding inflammatory change. Vascular/Lymphatic: There are no enlarged abdominal lymph nodes. No significant vascular findings. Other: No evidence of abdominal wall hernia or ascites. Musculoskeletal: No acute or significant osseous findings. IMPRESSION: 1. Cholelithiasis without evidence of gallbladder wall thickening. 2. Extrahepatic biliary dilatation has improved compared with recent ultrasound. This suggests possible interval passage of a common duct Marcin. There is a possible 2-3 mm residual Lopp in the distal common bile duct. 3. No evidence of pancreatitis. 4. Hepatic steatosis. Electronically  Signed   By: Carey Bullocks M.D.   On: 08/01/2020 14:29   US Abdomen Limited RUQ (LIVER/GB)  Result Date:  07/31/2020 CLINICAL DATA:  Right upper quadrant abdominal pain EXAM: ULTRASOUND ABDOMEN LIMITED RIGHT UPPER QUADRANT COMPARISON:  None. FINDINGS: Gallbladder: Multiple shadowing gallstones are seen centrally. The gallbladder is not distended. There is no gallbladder wall thickening or pericholecystic fluid. The sonographic Eulah Pont sign is reportedly positive. Common bile duct: Diameter: The proximal extrahepatic bile duct is dilated measuring 9 mm in diameter, best seen on image # 31 Liver: The hepatic parenchyma is diffusely echogenic and there is poor acoustic through transmission in keeping with changes of at least moderate hepatic steatosis. This limits the sensitivity for detection of focal intrahepatic masses though none are identified. There is no definite intrahepatic biliary ductal dilation. Portal vein is patent on color Doppler imaging with normal direction of blood flow towards the liver. Other: None. IMPRESSION: Cholelithiasis. Reportedly positive sonographic Eulah Pont sign is equivocal given the otherwise benign appearance of the gallbladder. Dilation of the extrahepatic bile duct. A distal obstructing lesion, such as choledocholithiasis, is not excluded. ERCP or MRCP examination would be helpful for further evaluation if there is clinical suspicion of a biliary obstructive process. Moderate hepatic steatosis. Electronically Signed   By: Helyn Numbers MD   On: 07/31/2020 23:30    Anti-infectives: Anti-infectives (From admission, onward)    Start     Dose/Rate Route Frequency Ordered Stop   08/02/20 0700  ciprofloxacin (CIPRO) IVPB 400 mg        400 mg 200 mL/hr over 60 Minutes Intravenous  Once 08/02/20 0630 08/02/20 1601        Assessment/Plan Cholelithiasis with Choledocholithiasis  - MRCP suspicious for passed CBD stones and residual choledocholithiasis. ERCP 7/21, no stones found - Will plan for OR tomorrow for Laparoscopic Cholecystectomy.  I have explained the procedure, risks, and  aftercare of Laparoscopic cholecystectomy.  Risks include but are not limited to anesthesia (MI, CVA, death), bleeding, infection, wound problems, hernia, bile leak, injury to common bile duct/liver/intestine and diarrhea post op.  He/she seems to understand and agrees to proceed.  FEN - NPO at midnight VTE - SCDs, okay for chemical prophylaxis from a general surgery standpoint  ID - Rocephin periop   LOS: 1 day    Jacinto Halim , [facility] 08/02/2020, 12:22 PM Please see Amion for pager number during day hours 7:00am-4:30pm

## 2020-08-02 NOTE — Anesthesia Postprocedure Evaluation (Signed)
Anesthesia Post Note  Patient: Sydney Gonzalez  Procedure(s) Performed: ENDOSCOPIC RETROGRADE CHOLANGIOPANCREATOGRAPHY (ERCP) SPHINCTEROTOMY REMOVAL OF STONES     Patient location during evaluation: PACU Anesthesia Type: General Level of consciousness: awake and alert Pain management: pain level controlled Vital Signs Assessment: post-procedure vital signs reviewed and stable Respiratory status: spontaneous breathing, nonlabored ventilation, respiratory function stable and patient connected to nasal cannula oxygen Cardiovascular status: blood pressure returned to baseline and stable Postop Assessment: no apparent nausea or vomiting Anesthetic complications: no   No notable events documented.  Last Vitals:  Vitals:   08/02/20 1111 08/02/20 1322  BP: 130/76 123/85  Pulse: 80 96  Resp: 18 16  Temp: 36.9 C   SpO2: 95% 98%    Last Pain:  Vitals:   08/02/20 1111  TempSrc: Oral  PainSc: 0-No pain                 Kennieth Rad

## 2020-08-02 NOTE — Progress Notes (Signed)
Sydney Gonzalez 7:48 AM  Subjective: Patient doing well without any new complaints and we rediscussed the procedure and answered all of her questions  Objective: Vital signs stable afebrile no acute distress exam please see preassessment evaluation LFTs decreased nicely MRCP reviewed believe there is a Deboard there  Assessment: Probable CBD Vallin  Plan: Okay to proceed with ERCP with anesthesia assistance and if no delayed complications probably surgical options tomorrow  [facility]  office [phone] After 5PM or if no answer call [phone]

## 2020-08-02 NOTE — Op Note (Signed)
[facility] Patient Name: Sydney Gonzalez Procedure Date: 08/02/2020 MRN: [id number] Attending MD: Vida Rigger , MD Date of Birth: 01-Oct-1985 CSN: [id number] Age: 35 Admit Type: Inpatient Procedure:                ERCP Indications:              For therapy of bile duct [place](s) Providers:                Vida Rigger, MD, Rogue Jury, RN, Lawson Radar,                            Technician, Fayrene Fearing, RN Referring MD:              Medicines:                General Anesthesia Complications:            No immediate complications. Estimated Blood Loss:     Estimated blood loss: none. Procedure:                Pre-Anesthesia Assessment:                           - Prior to the procedure, a History and Physical                            was performed, and patient medications and                            allergies were reviewed. The patient's tolerance of                            previous anesthesia was also reviewed. The risks                            and benefits of the procedure and the sedation                            options and risks were discussed with the patient.                            All questions were answered, and informed consent                            was obtained. Prior Anticoagulants: The patient has                            taken no previous anticoagulant or antiplatelet                            agents. ASA Grade Assessment: II - A patient with                            mild systemic disease. After reviewing the risks  and benefits, the patient was deemed in                            satisfactory condition to undergo the procedure.                           After obtaining informed consent, the scope was                            passed under direct vision. Throughout the                            procedure, the patient's blood pressure, pulse, and                            oxygen saturations were  monitored continuously. The                            ERCP [id number] was introduced through the mouth, and                            used to inject contrast into and used to locate the                            major papilla. The ERCP was accomplished without                            difficulty. The patient tolerated the procedure                            well. 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 Deep selective cannulation was readily       obtained and no obvious Tesmer was seen on initial cholangiogram and we       proceeded with a biliary sphincterotomy which was made with a Hydratome       sphincterotome using ERBE electrocautery. There was no       post-sphincterotomy bleeding. We proceeded until we had adequate biliary       drainage and could get the fully bowed sphincterotome easily in and out       of the duct and then the biliary tree was swept with a 12 mm balloon       starting at the bifurcation and left intrahepatic duct(s). Nothing was       found on multiple balloon pull-through's but dirty bile was flowing so       quickly that we could have easily miss seeing the Evers pass through the       widely patent sphincterotomy site and an occlusion cholangiogram did not       reveal any residual abnormalities and we elected to stop the procedure       at this point and the balloon wire and scope were removed and the       patient tolerated the procedure well and not mentioned above there was       no pancreatic duct injection or wire advancement throughout the  procedure. Impression:               - The major papilla appeared congested.                           - A biliary sphincterotomy was performed.                           - The biliary tree was swept and nothing was found                            at the end of the procedure on occlusion                            cholangiogram. Moderate Sedation:      Not Applicable -  Patient had care per Anesthesia. Recommendation:           - Clear liquid diet for 6 hours. If doing well and                            okay with surgical team could have soft solids this                            evening but then n.p.o. after midnight for probable                            lap chole tomorrow                           - Return to GI clinic PRN.                           - Telephone GI clinic if symptomatic PRN.                           - Check liver enzymes (AST, ALT, alkaline                            phosphatase, bilirubin) tomorrow and follow back to                            normal as an outpatient.                           - Refer to a surgeon as previously scheduled for                            lap chole tomorrow and less delayed complication or                            new problem. Procedure Code(s):        --- Professional ---                           [phone], Esophagogastroduodenoscopy, flexible,  transoral; diagnostic, including collection of                            specimen(s) by brushing or washing, when performed                            (separate procedure) Diagnosis Code(s):        --- Professional ---                           K80.50, Calculus of bile duct without cholangitis                            or cholecystitis without obstruction                           K83.8, Other specified diseases of biliary tract CPT copyright 2019 [employer]. All rights reserved. The codes documented in this report are preliminary and upon coder review may  be revised to meet current compliance requirements. Vida RiggerMarc Avry Monteleone, MD 08/02/2020 8:40:22 AM This report has been signed electronically. Number of Addenda: 0

## 2020-08-02 NOTE — Transfer of Care (Signed)
Immediate Anesthesia Transfer of Care Note  Patient: Sydney Gonzalez  Procedure(s) Performed: ENDOSCOPIC RETROGRADE CHOLANGIOPANCREATOGRAPHY (ERCP) SPHINCTEROTOMY  Patient Location: PACU  Anesthesia Type:General  Level of Consciousness: awake, alert  and oriented  Airway & Oxygen Therapy: Patient Spontanous Breathing and Patient connected to face mask oxygen  Post-op Assessment: Report given to RN and Post -op Vital signs reviewed and stable  Post vital signs: Reviewed and stable  Last Vitals:  Vitals Value Taken Time  BP 125/79 08/02/20 0845  Temp    Pulse 90 08/02/20 0847  Resp 15 08/02/20 0847  SpO2 100 % 08/02/20 0847  Vitals shown include unvalidated device data.  Last Pain:  Vitals:   08/02/20 0704  TempSrc:   PainSc: 0-No pain         Complications: No notable events documented.

## 2020-08-02 NOTE — Anesthesia Preprocedure Evaluation (Signed)
Anesthesia Evaluation  Patient identified by MRN, date of birth, ID band Patient awake    Reviewed: Allergy & Precautions, NPO status , Patient's Chart, lab work & pertinent test results  Airway Mallampati: II  TM Distance: >3 FB     Dental  (+) Dental Advisory Given   Pulmonary neg pulmonary ROS,    breath sounds clear to auscultation       Cardiovascular negative cardio ROS   Rhythm:Regular Rate:Normal     Neuro/Psych negative neurological ROS     GI/Hepatic Neg liver ROS, GERD  ,  Endo/Other  Morbid obesity  Renal/GU negative Renal ROS     Musculoskeletal   Abdominal   Peds  Hematology negative hematology ROS (+)   Anesthesia Other Findings   Reproductive/Obstetrics                             Lab Results  Component Value Date   WBC 5.6 08/01/2020   HGB 14.5 08/01/2020   HCT 44.4 08/01/2020   MCV 89.2 08/01/2020   PLT 291 08/01/2020   Lab Results  Component Value Date   CREATININE 0.67 08/02/2020   BUN 8 08/02/2020   NA 141 08/02/2020   K 3.7 08/02/2020   CL 109 08/02/2020   CO2 21 (L) 08/02/2020    Anesthesia Physical Anesthesia Plan  ASA: 3  Anesthesia Plan: General   Post-op Pain Management:    Induction: Intravenous  PONV Risk Score and Plan: 3 and Ondansetron, Dexamethasone, Midazolam and Treatment may vary due to age or medical condition  Airway Management Planned: Oral ETT  Additional Equipment: None  Intra-op Plan:   Post-operative Plan: Extubation in OR  Informed Consent: I have reviewed the patients History and Physical, chart, labs and discussed the procedure including the risks, benefits and alternatives for the proposed anesthesia with the patient or authorized representative who has indicated his/her understanding and acceptance.     Dental advisory given  Plan Discussed with: CRNA  Anesthesia Plan Comments:         Anesthesia  Quick Evaluation

## 2020-08-02 NOTE — Progress Notes (Addendum)
Day of Surgery  Subjective: CC: Post ERCP. No abdominal pain, n/v. Tolerating cld.   Objective: Vital signs in last 24 hours: Temp:  [97.5 F (36.4 C)-99.3 F (37.4 C)] 98.5 F (36.9 C) (07/21 1111) Pulse Rate:  [72-89] 80 (07/21 1111) Resp:  [10-19] 18 (07/21 1111) BP: (114-138)/(54-92) 130/76 (07/21 1111) SpO2:  [95 %-100 %] 95 % (07/21 1111) Weight:  [121.6 kg] 121.6 kg (07/21 0704) Last BM Date: 07/31/20  Intake/Output from previous day: 07/20 0701 - 07/21 0700 In: 1320.9 [P.O.:240; I.V.:1080.9] Out: 2200 [Urine:2200] Intake/Output this shift: Total I/O In: 760 [P.O.:60; I.V.:500; IV Piggyback:200] Out: 0   PE: Gen:  Alert, NAD, pleasant Card:  RRR Pulm:  CTA b/l Abd: Soft, ND, NT +BS, Psych: A&Ox3  Skin: no rashes noted, warm and dry  Lab Results:  Recent Labs    07/31/20 1735 08/01/20 0956  WBC 10.0 5.6  HGB 14.0 14.5  HCT 43.0 44.4  PLT 329 291   BMET Recent Labs    08/01/20 0956 08/02/20 0430  NA 143 141  K 3.9 3.7  CL 105 109  CO2 24 21*  GLUCOSE 88 87  BUN 9 8  CREATININE 0.58 0.67  CALCIUM 9.4 8.6*   PT/INR No results for input(s): LABPROT, INR in the last 72 hours. CMP     Component Value Date/Time   NA 141 08/02/2020 0430   K 3.7 08/02/2020 0430   CL 109 08/02/2020 0430   CO2 21 (L) 08/02/2020 0430   GLUCOSE 87 08/02/2020 0430   BUN 8 08/02/2020 0430   CREATININE 0.67 08/02/2020 0430   CALCIUM 8.6 (L) 08/02/2020 0430   PROT 6.6 08/02/2020 0430   ALBUMIN 3.8 08/02/2020 0430   AST 203 (H) 08/02/2020 0430   ALT 382 (H) 08/02/2020 0430   ALKPHOS 97 08/02/2020 0430   BILITOT 1.6 (H) 08/02/2020 0430   GFRNONAA >60 08/02/2020 0430   GFRAA >60 04/16/2019 1434   Lipase     Component Value Date/Time   LIPASE 32 08/02/2020 0430    Studies/Results: MR 3D Recon At Scanner  Result Date: 08/01/2020 CLINICAL DATA:  Right upper quadrant abdominal pain and jaundice. Abnormal ultrasound. EXAM: MRI ABDOMEN WITHOUT AND WITH  CONTRAST (INCLUDING MRCP) TECHNIQUE: Multiplanar multisequence MR imaging of the abdomen was performed both before and after the administration of intravenous contrast. Heavily T2-weighted images of the biliary and pancreatic ducts were obtained, and three-dimensional MRCP images were rendered by post processing. CONTRAST:  62mL GADAVIST GADOBUTROL 1 MMOL/ML IV SOLN COMPARISON:  Ultrasound 07/31/2020 FINDINGS: Despite efforts by the technologist and patient, mild motion artifact is present on today's exam and could not be eliminated. This reduces exam sensitivity and specificity. Lower chest: Trace pleural effusions and mild atelectasis at both lung bases. Hepatobiliary: There is loss of signal in the liver on the gradient echo opposed phase images consistent with steatosis. No focal lesion or abnormal enhancement identified. As seen on ultrasound, there are multiple gallstones. No evidence of gallbladder wall thickening or surrounding inflammation. No significant residual intrahepatic or extrahepatic biliary dilatation. The common hepatic duct measures 6 mm in diameter (9 mm on ultrasound). Equivocal findings for a 2-3 mm Barnhart in the distal common bile duct (best seen on image 20/8 and 1/14). Pancreas: Unremarkable. No pancreatic ductal dilatation or surrounding inflammatory changes. Spleen: Normal in size without focal abnormality. Adrenals/Urinary Tract: Both adrenal glands appear normal. Both kidneys appear normal without evidence of hydronephrosis or mass lesion. Stomach/Bowel: The stomach appears  unremarkable for its degree of distension. No evidence of bowel wall thickening, distention or surrounding inflammatory change. Vascular/Lymphatic: There are no enlarged abdominal lymph nodes. No significant vascular findings. Other: No evidence of abdominal wall hernia or ascites. Musculoskeletal: No acute or significant osseous findings. IMPRESSION: 1. Cholelithiasis without evidence of gallbladder wall thickening.  2. Extrahepatic biliary dilatation has improved compared with recent ultrasound. This suggests possible interval passage of a common duct Speas. There is a possible 2-3 mm residual Holstine in the distal common bile duct. 3. No evidence of pancreatitis. 4. Hepatic steatosis. Electronically Signed   By: Carey Bullocks M.D.   On: 08/01/2020 14:29   DG ERCP BILIARY & PANCREATIC DUCTS  Result Date: 08/02/2020 CLINICAL DATA:  Cholelithiasis, concern for choledocholithiasis EXAM: ERCP with sphincterotomy and balloon sweep TECHNIQUE: Multiple spot images obtained with the fluoroscopic device and submitted for interpretation post-procedure. FLUOROSCOPY TIME:  Fluoroscopy Time:  1 minutes 49 seconds Number of Acquired Spot Images: Several spot fluoroscopic views COMPARISON:  08/01/2020 MR FINDINGS: Limited spot fluoroscopic views during the ERCP demonstrate retrograde guidewire access and contrast injection into the biliary tree. Mild CBD dilatation. Balloon sweep performed. No large filling defects appreciated or obstruction pattern. Intrahepatic ducts appear nondilated. IMPRESSION: Successful ERCP with CBD balloon sweep. These images were submitted for radiologic interpretation only. Please see the procedural report for the amount of contrast and the fluoroscopy time utilized. Electronically Signed   By: Judie Petit.  Shick M.D.   On: 08/02/2020 08:52   MR ABDOMEN MRCP W WO CONTAST  Result Date: 08/01/2020 CLINICAL DATA:  Right upper quadrant abdominal pain and jaundice. Abnormal ultrasound. EXAM: MRI ABDOMEN WITHOUT AND WITH CONTRAST (INCLUDING MRCP) TECHNIQUE: Multiplanar multisequence MR imaging of the abdomen was performed both before and after the administration of intravenous contrast. Heavily T2-weighted images of the biliary and pancreatic ducts were obtained, and three-dimensional MRCP images were rendered by post processing. CONTRAST:  21mL GADAVIST GADOBUTROL 1 MMOL/ML IV SOLN COMPARISON:  Ultrasound 07/31/2020  FINDINGS: Despite efforts by the technologist and patient, mild motion artifact is present on today's exam and could not be eliminated. This reduces exam sensitivity and specificity. Lower chest: Trace pleural effusions and mild atelectasis at both lung bases. Hepatobiliary: There is loss of signal in the liver on the gradient echo opposed phase images consistent with steatosis. No focal lesion or abnormal enhancement identified. As seen on ultrasound, there are multiple gallstones. No evidence of gallbladder wall thickening or surrounding inflammation. No significant residual intrahepatic or extrahepatic biliary dilatation. The common hepatic duct measures 6 mm in diameter (9 mm on ultrasound). Equivocal findings for a 2-3 mm Roza in the distal common bile duct (best seen on image 20/8 and 1/14). Pancreas: Unremarkable. No pancreatic ductal dilatation or surrounding inflammatory changes. Spleen: Normal in size without focal abnormality. Adrenals/Urinary Tract: Both adrenal glands appear normal. Both kidneys appear normal without evidence of hydronephrosis or mass lesion. Stomach/Bowel: The stomach appears unremarkable for its degree of distension. No evidence of bowel wall thickening, distention or surrounding inflammatory change. Vascular/Lymphatic: There are no enlarged abdominal lymph nodes. No significant vascular findings. Other: No evidence of abdominal wall hernia or ascites. Musculoskeletal: No acute or significant osseous findings. IMPRESSION: 1. Cholelithiasis without evidence of gallbladder wall thickening. 2. Extrahepatic biliary dilatation has improved compared with recent ultrasound. This suggests possible interval passage of a common duct Marcin. There is a possible 2-3 mm residual Lopp in the distal common bile duct. 3. No evidence of pancreatitis. 4. Hepatic steatosis. Electronically  Signed   By: Carey Bullocks M.D.   On: 08/01/2020 14:29   US Abdomen Limited RUQ (LIVER/GB)  Result Date:  07/31/2020 CLINICAL DATA:  Right upper quadrant abdominal pain EXAM: ULTRASOUND ABDOMEN LIMITED RIGHT UPPER QUADRANT COMPARISON:  None. FINDINGS: Gallbladder: Multiple shadowing gallstones are seen centrally. The gallbladder is not distended. There is no gallbladder wall thickening or pericholecystic fluid. The sonographic Eulah Pont sign is reportedly positive. Common bile duct: Diameter: The proximal extrahepatic bile duct is dilated measuring 9 mm in diameter, best seen on image # 31 Liver: The hepatic parenchyma is diffusely echogenic and there is poor acoustic through transmission in keeping with changes of at least moderate hepatic steatosis. This limits the sensitivity for detection of focal intrahepatic masses though none are identified. There is no definite intrahepatic biliary ductal dilation. Portal vein is patent on color Doppler imaging with normal direction of blood flow towards the liver. Other: None. IMPRESSION: Cholelithiasis. Reportedly positive sonographic Eulah Pont sign is equivocal given the otherwise benign appearance of the gallbladder. Dilation of the extrahepatic bile duct. A distal obstructing lesion, such as choledocholithiasis, is not excluded. ERCP or MRCP examination would be helpful for further evaluation if there is clinical suspicion of a biliary obstructive process. Moderate hepatic steatosis. Electronically Signed   By: Helyn Numbers MD   On: 07/31/2020 23:30    Anti-infectives: Anti-infectives (From admission, onward)    Start     Dose/Rate Route Frequency Ordered Stop   08/02/20 0700  ciprofloxacin (CIPRO) IVPB 400 mg        400 mg 200 mL/hr over 60 Minutes Intravenous  Once 08/02/20 0630 08/02/20 1601        Assessment/Plan Cholelithiasis with Choledocholithiasis  - MRCP suspicious for passed CBD stones and residual choledocholithiasis. ERCP 7/21, no stones found - Will plan for OR tomorrow for Laparoscopic Cholecystectomy.  I have explained the procedure, risks, and  aftercare of Laparoscopic cholecystectomy.  Risks include but are not limited to anesthesia (MI, CVA, death), bleeding, infection, wound problems, hernia, bile leak, injury to common bile duct/liver/intestine and diarrhea post op.  He/she seems to understand and agrees to proceed.  FEN - NPO at midnight VTE - SCDs, okay for chemical prophylaxis from a general surgery standpoint  ID - Rocephin periop   LOS: 1 day    Jacinto Halim , [facility] 08/02/2020, 12:22 PM Please see Amion for pager number during day hours 7:00am-4:30pm

## 2020-08-02 NOTE — Anesthesia Procedure Notes (Signed)
Procedure Name: Intubation Date/Time: 08/02/2020 8:01 AM Performed by: Maxwell Caul, CRNA Pre-anesthesia Checklist: Patient identified, Emergency Drugs available, Suction available and Patient being monitored Patient Re-evaluated:Patient Re-evaluated prior to induction Oxygen Delivery Method: Circle system utilized Preoxygenation: Pre-oxygenation with 100% oxygen Induction Type: IV induction Ventilation: Mask ventilation without difficulty Laryngoscope Size: Mac and 4 Grade View: Grade I Tube type: Oral Tube size: 7.5 mm Number of attempts: 1 Airway Equipment and Method: Stylet Placement Confirmation: ETT inserted through vocal cords under direct vision, positive ETCO2 and breath sounds checked- equal and bilateral Secured at: 21 cm Tube secured with: Tape Dental Injury: Teeth and Oropharynx as per pre-operative assessment

## 2020-08-03 ENCOUNTER — Inpatient Hospital Stay (HOSPITAL_COMMUNITY): Payer: 59 | Admitting: Certified Registered Nurse Anesthetist

## 2020-08-03 ENCOUNTER — Encounter (HOSPITAL_COMMUNITY): Payer: Self-pay | Admitting: Family Medicine

## 2020-08-03 ENCOUNTER — Encounter (HOSPITAL_COMMUNITY)
Admission: EM | Disposition: A | Discharge status: Home / Self Care | Payer: Self-pay | Source: Home / Self Care | Attending: Student

## 2020-08-03 DIAGNOSIS — K8021 Calculus of gallbladder without cholecystitis with obstruction: Secondary | ICD-10-CM | POA: Diagnosis not present

## 2020-08-03 DIAGNOSIS — R748 Abnormal levels of other serum enzymes: Secondary | ICD-10-CM | POA: Diagnosis not present

## 2020-08-03 DIAGNOSIS — F419 Anxiety disorder, unspecified: Secondary | ICD-10-CM | POA: Diagnosis not present

## 2020-08-03 HISTORY — PX: CHOLECYSTECTOMY: SHX55

## 2020-08-03 LAB — COMPREHENSIVE METABOLIC PANEL
ALT: 270 U/L — ABNORMAL HIGH (ref 0–44)
AST: 71 U/L — ABNORMAL HIGH (ref 15–41)
Albumin: 3.7 g/dL (ref 3.5–5.0)
Alkaline Phosphatase: 93 U/L (ref 38–126)
Anion gap: 6 (ref 5–15)
BUN: 7 mg/dL (ref 6–20)
CO2: 21 mmol/L — ABNORMAL LOW (ref 22–32)
Calcium: 8.6 mg/dL — ABNORMAL LOW (ref 8.9–10.3)
Chloride: 112 mmol/L — ABNORMAL HIGH (ref 98–111)
Creatinine, Ser: 0.82 mg/dL (ref 0.44–1.00)
GFR, Estimated: >60 mL/min (ref >60)
Glucose, Bld: 97 mg/dL (ref 70–99)
Potassium: 3.8 mmol/L (ref 3.5–5.1)
Sodium: 139 mmol/L (ref 135–145)
Total Bilirubin: 1.1 mg/dL (ref 0.3–1.2)
Total Protein: 6.9 g/dL (ref 6.5–8.1)

## 2020-08-03 LAB — CBC
HCT: 40.3 % (ref 36.0–46.0)
Hemoglobin: 13.2 g/dL (ref 12.0–15.0)
MCH: 29.2 pg (ref 26.0–34.0)
MCHC: 32.8 g/dL (ref 30.0–36.0)
MCV: 89.2 fL (ref 80.0–100.0)
Platelets: 308 10*3/uL (ref 150–400)
RBC: 4.52 MIL/uL (ref 3.87–5.11)
RDW: 14.3 % (ref 11.5–15.5)
WBC: 10.9 10*3/uL — ABNORMAL HIGH (ref 4.0–10.5)
nRBC: 0 % (ref 0.0–0.2)

## 2020-08-03 LAB — SURGICAL PCR SCREEN
MRSA, PCR: NEGATIVE
Staphylococcus aureus: NEGATIVE

## 2020-08-03 SURGERY — LAPAROSCOPIC CHOLECYSTECTOMY WITH INTRAOPERATIVE CHOLANGIOGRAM
Anesthesia: General

## 2020-08-03 MED ORDER — FENTANYL CITRATE (PF) 250 MCG/5ML IJ SOLN
INTRAMUSCULAR | Status: DC | PRN
  Administered 2020-08-03: 50 ug via INTRAVENOUS
  Administered 2020-08-03: 100 ug via INTRAVENOUS
  Administered 2020-08-03 (×2): 50 ug via INTRAVENOUS

## 2020-08-03 MED ORDER — MIDAZOLAM HCL 2 MG/2ML IJ SOLN: 0.5000 mg | Freq: Once | INTRAMUSCULAR | Status: DC | PRN

## 2020-08-03 MED ORDER — OXYCODONE HCL 5 MG PO TABS
5.0000 mg | ORAL_TABLET | ORAL | Status: DC | PRN
Start: 2020-08-03 — End: 2020-08-04

## 2020-08-03 MED ORDER — OXYCODONE HCL 5 MG/5ML PO SOLN: 5.0000 mg | Freq: Once | ORAL | Status: AC | PRN

## 2020-08-03 MED ORDER — OXYCODONE HCL 5 MG PO TABS
ORAL_TABLET | ORAL | Status: AC
  Filled 2020-08-03: qty 1

## 2020-08-03 MED ORDER — BUPIVACAINE-EPINEPHRINE (PF) 0.25% -1:200000 IJ SOLN
INTRAMUSCULAR | Status: AC
  Filled 2020-08-03: qty 30

## 2020-08-03 MED ORDER — BUPIVACAINE-EPINEPHRINE 0.25% -1:200000 IJ SOLN
INTRAMUSCULAR | Status: DC | PRN
  Administered 2020-08-03: 20 mL

## 2020-08-03 MED ORDER — LIDOCAINE HCL (CARDIAC) PF 100 MG/5ML IV SOSY
PREFILLED_SYRINGE | INTRAVENOUS | Status: DC | PRN
  Administered 2020-08-03: 100 mg via INTRAVENOUS

## 2020-08-03 MED ORDER — SCOPOLAMINE 1 MG/3DAYS TD PT72
1.0000 | MEDICATED_PATCH | TRANSDERMAL | Status: DC
  Administered 2020-08-03: 1 via TRANSDERMAL
  Filled 2020-08-03: qty 1

## 2020-08-03 MED ORDER — MORPHINE SULFATE (PF) 2 MG/ML IV SOLN: 2.0000 mg | INTRAVENOUS | Status: DC | PRN

## 2020-08-03 MED ORDER — ACETAMINOPHEN 500 MG PO TABS: 1000.0000 mg | ORAL_TABLET | ORAL | Status: AC

## 2020-08-03 MED ORDER — ONDANSETRON HCL 4 MG/2ML IJ SOLN
INTRAMUSCULAR | Status: DC | PRN
  Administered 2020-08-03: 4 mg via INTRAVENOUS

## 2020-08-03 MED ORDER — MIDAZOLAM HCL 2 MG/2ML IJ SOLN
INTRAMUSCULAR | Status: AC
  Filled 2020-08-03: qty 2

## 2020-08-03 MED ORDER — HYDROMORPHONE HCL 1 MG/ML IJ SOLN
0.2500 mg | INTRAMUSCULAR | Status: DC | PRN
  Administered 2020-08-03: 0.25 mg via INTRAVENOUS
  Administered 2020-08-03: 0.5 mg via INTRAVENOUS

## 2020-08-03 MED ORDER — SUGAMMADEX SODIUM 200 MG/2ML IV SOLN
INTRAVENOUS | Status: DC | PRN
  Administered 2020-08-03: 200 mg via INTRAVENOUS

## 2020-08-03 MED ORDER — LACTATED RINGERS IR SOLN
Status: DC | PRN
  Administered 2020-08-03: 1000 mL

## 2020-08-03 MED ORDER — MEPERIDINE HCL 50 MG/ML IJ SOLN: 6.2500 mg | INTRAMUSCULAR | Status: DC | PRN

## 2020-08-03 MED ORDER — FENTANYL CITRATE (PF) 250 MCG/5ML IJ SOLN
INTRAMUSCULAR | Status: AC
  Filled 2020-08-03: qty 5

## 2020-08-03 MED ORDER — MIDAZOLAM HCL 2 MG/2ML IJ SOLN
INTRAMUSCULAR | Status: DC | PRN
  Administered 2020-08-03: 2 mg via INTRAVENOUS

## 2020-08-03 MED ORDER — PROMETHAZINE HCL 25 MG/ML IJ SOLN: 6.2500 mg | INTRAMUSCULAR | Status: DC | PRN

## 2020-08-03 MED ORDER — ROCURONIUM BROMIDE 10 MG/ML (PF) SYRINGE
PREFILLED_SYRINGE | INTRAVENOUS | Status: DC | PRN
  Administered 2020-08-03: 60 mg via INTRAVENOUS

## 2020-08-03 MED ORDER — DEXAMETHASONE SODIUM PHOSPHATE 10 MG/ML IJ SOLN
INTRAMUSCULAR | Status: DC | PRN
  Administered 2020-08-03: 5 mg via INTRAVENOUS

## 2020-08-03 MED ORDER — ACETAMINOPHEN 500 MG PO TABS
1000.0000 mg | ORAL_TABLET | Freq: Three times a day (TID) | ORAL | Status: DC | PRN
  Administered 2020-08-03 – 2020-08-04 (×2): 1000 mg via ORAL
  Filled 2020-08-03 (×2): qty 2

## 2020-08-03 MED ORDER — OXYCODONE HCL 5 MG PO TABS
5.0000 mg | ORAL_TABLET | Freq: Once | ORAL | Status: AC | PRN
  Administered 2020-08-03: 5 mg via ORAL

## 2020-08-03 MED ORDER — PROPOFOL 10 MG/ML IV BOLUS
INTRAVENOUS | Status: DC | PRN
  Administered 2020-08-03: 200 mg via INTRAVENOUS

## 2020-08-03 MED ORDER — HYDROMORPHONE HCL 1 MG/ML IJ SOLN
INTRAMUSCULAR | Status: AC
  Administered 2020-08-03: 0.25 mg via INTRAVENOUS
  Filled 2020-08-03: qty 1

## 2020-08-03 SURGICAL SUPPLY — 30 items
APPLIER CLIP 5 13 M/L LIGAMAX5 (MISCELLANEOUS) ×2
BAG COUNTER SPONGE SURGICOUNT (BAG) IMPLANT
CABLE HIGH FREQUENCY MONO STRZ (ELECTRODE) ×2 IMPLANT
CATH REDDICK CHOLANGI 4FR 50CM (CATHETERS) ×2 IMPLANT
CHLORAPREP W/TINT 26 (MISCELLANEOUS) ×2 IMPLANT
CLIP APPLIE 5 13 M/L LIGAMAX5 (MISCELLANEOUS) ×1 IMPLANT
COVER MAYO STAND STRL (DRAPES) ×2 IMPLANT
DECANTER SPIKE VIAL GLASS SM (MISCELLANEOUS) ×2 IMPLANT
DERMABOND ADVANCED (GAUZE/BANDAGES/DRESSINGS) ×1
DERMABOND ADVANCED .7 DNX12 (GAUZE/BANDAGES/DRESSINGS) ×1 IMPLANT
DRAPE C-ARM 42X120 X-RAY (DRAPES) ×2 IMPLANT
ELECT REM PT RETURN 15FT ADLT (MISCELLANEOUS) ×2 IMPLANT
GLOVE SURG ENC MOIS LTX SZ7.5 (GLOVE) ×2 IMPLANT
GOWN STRL REUS W/TWL XL LVL3 (GOWN DISPOSABLE) ×6 IMPLANT
HEMOSTAT SURGICEL 4X8 (HEMOSTASIS) IMPLANT
IV CATH 14GX2 1/4 (CATHETERS) ×2 IMPLANT
KIT BASIN OR (CUSTOM PROCEDURE TRAY) ×2 IMPLANT
KIT TURNOVER KIT A (KITS) ×2 IMPLANT
PENCIL SMOKE EVACUATOR (MISCELLANEOUS) IMPLANT
POUCH SPECIMEN RETRIEVAL 10MM (ENDOMECHANICALS) ×2 IMPLANT
SCISSORS LAP 5X35 DISP (ENDOMECHANICALS) ×2 IMPLANT
SET IRRIG TUBING LAPAROSCOPIC (IRRIGATION / IRRIGATOR) ×2 IMPLANT
SET TUBE SMOKE EVAC HIGH FLOW (TUBING) ×2 IMPLANT
SLEEVE XCEL OPT CAN 5 100 (ENDOMECHANICALS) ×4 IMPLANT
SUT MNCRL AB 4-0 PS2 18 (SUTURE) ×2 IMPLANT
TOWEL OR 17X26 10 PK STRL BLUE (TOWEL DISPOSABLE) ×2 IMPLANT
TOWEL OR NON WOVEN STRL DISP B (DISPOSABLE) ×2 IMPLANT
TRAY LAPAROSCOPIC (CUSTOM PROCEDURE TRAY) ×2 IMPLANT
TROCAR BLADELESS OPT 5 100 (ENDOMECHANICALS) ×2 IMPLANT
TROCAR XCEL BLUNT TIP 100MML (ENDOMECHANICALS) ×2 IMPLANT

## 2020-08-03 NOTE — Transfer of Care (Signed)
Immediate Anesthesia Transfer of Care Note  Patient: Sydney Gonzalez  Procedure(s) Performed: LAPAROSCOPIC CHOLECYSTECTOMY WITH INTRAOPERATIVE CHOLANGIOGRAM  Patient Location: PACU  Anesthesia Type:General  Level of Consciousness: awake, alert , oriented, drowsy and patient cooperative  Airway & Oxygen Therapy: Patient Spontanous Breathing and Patient connected to face mask oxygen  Post-op Assessment: Report given to RN and Post -op Vital signs reviewed and stable  Post vital signs: Reviewed and stable  Last Vitals:  Vitals Value Taken Time  BP 131/77 08/03/20 1209  Temp    Pulse 81 08/03/20 1212  Resp 18 08/03/20 1212  SpO2 100 % 08/03/20 1212  Vitals shown include unvalidated device data.  Last Pain:  Vitals:   08/03/20 0624  TempSrc: Oral  PainSc:          Complications: No notable events documented.

## 2020-08-03 NOTE — Op Note (Signed)
08/03/2020  11:53 AM  PATIENT:  Sydney Gonzalez  35 y.o. female  PRE-OPERATIVE DIAGNOSIS:  cholecystitis, cholelithiasis  POST-OPERATIVE DIAGNOSIS:  cholecystitis, cholelithiasis  PROCEDURE:  Procedure(s): LAPAROSCOPIC CHOLECYSTECTOMY   SURGEON:  Surgeon(s) and Role:    * Griselda Miner, MD - Primary  PHYSICIAN ASSISTANT:   ASSISTANTS: Myrtie Soman, RNFA   ANESTHESIA:   local and general  EBL:  20 mL   BLOOD ADMINISTERED:none  DRAINS: none   LOCAL MEDICATIONS USED:  MARCAINE     SPECIMEN:  Source of Specimen:  gallbladder  DISPOSITION OF SPECIMEN:  PATHOLOGY  COUNTS:  YES  TOURNIQUET:  * No tourniquets in log *  DICTATION: .Dragon Dictation    Procedure: After informed consent was obtained the patient was brought to the operating room and placed in the supine position on the operating room table. After adequate induction of general anesthesia the patient's abdomen was prepped with ChloraPrep allowed to dry and draped in usual sterile manner. An appropriate timeout was performed. The area above the umbilicus was infiltrated with quarter percent  Marcaine. A small incision was made with a 15 blade knife. The incision was carried down through the subcutaneous tissue bluntly with a hemostat and [employer] retractors. The linea alba was identified. The linea alba was incised with a 15 blade knife and each side was grasped with Coker clamps. The preperitoneal space was then probed with a hemostat until the peritoneum was opened and access was gained to the abdominal cavity. A 0 Vicryl pursestring stitch was placed in the fascia surrounding the opening. A Hassan cannula was then placed through the opening and anchored in place with the previously placed Vicryl purse string stitch. The abdomen was insufflated with carbon dioxide without difficulty. A laparoscope was inserted through the [facility] cannula in the right upper quadrant was inspected. Next the epigastric region was  infiltrated with % Marcaine. A small incision was made with a 15 blade knife. A 5 mm port was placed bluntly through this incision into the abdominal cavity under direct vision. Next 2 sites were chosen laterally on the right side of the abdomen for placement of 5 mm ports. Each of these areas was infiltrated with quarter percent Marcaine. Small stab incisions were made with a 15 blade knife. 5 mm ports were then placed bluntly through these incisions into the abdominal cavity under direct vision without difficulty. A blunt grasper was placed through the lateralmost 5 mm port and used to grasp the dome of the gallbladder and elevated anteriorly and superiorly. Another blunt grasper was placed through the other 5 mm port and used to retract the body and neck of the gallbladder. A dissector was placed through the epigastric port and using the electrocautery the peritoneal reflection at the gallbladder neck was opened. Blunt dissection was then carried out in this area until the gallbladder neck-cystic duct junction was readily identified and a good critical window was created. A single clip was placed on the gallbladder neck and 3 clips were placed proximally on the cystic duct and the duct was divided between the 2 sets of clips. Posterior to this the cystic artery was identified and again dissected bluntly in a circumferential manner until a good window  was created. 2 clips were placed proximally and one distally on the artery and the artery was divided between the 2 sets of clips. Next a laparoscopic hook cautery device was used to separate the gallbladder from the liver bed. Prior to completely detaching the gallbladder  from the liver bed the liver bed was inspected and several small bleeding points were coagulated with the electrocautery until the area was completely hemostatic. The gallbladder was then detached the rest of it from the liver bed without difficulty. A laparoscopic bag was inserted through the  hassan port. The laparoscope was moved to the epigastric port. The gallbladder was placed within the bag and the bag was sealed.  The bag with the gallbladder was then removed with the [facility] cannula through the infraumbilical port without difficulty. The fascial defect was then closed with the previously placed Vicryl pursestring stitch as well as with another figure-of-eight 0 Vicryl stitch. The liver bed was inspected again and found to be hemostatic. The abdomen was irrigated with copious amounts of saline until the effluent was clear. The ports were then removed under direct vision without difficulty and were found to be hemostatic. The gas was allowed to escape. The skin incisions were all closed with interrupted 4-0 Monocryl subcuticular stitches. Dermabond dressings were applied. The patient tolerated the procedure well. At the end of the case all needle sponge and instrument counts were correct. The patient was then awakened and taken to recovery in stable condition   PLAN OF CARE: Admit for overnight observation  PATIENT DISPOSITION:  PACU - hemodynamically stable.   Delay start of Pharmacological VTE agent (>24hrs) due to surgical blood loss or risk of bleeding: no

## 2020-08-03 NOTE — Anesthesia Preprocedure Evaluation (Addendum)
Anesthesia Evaluation  Patient identified by MRN, date of birth, ID band Patient awake    Reviewed: Allergy & Precautions, NPO status , Patient's Chart, lab work & pertinent test results  History of Anesthesia Complications Negative for: history of anesthetic complications  Airway Mallampati: II  TM Distance: >3 FB Neck ROM: Full    Dental  (+) Dental Advisory Given   Pulmonary neg pulmonary ROS,  08/01/2020 SARS coronavirus NEG   breath sounds clear to auscultation       Cardiovascular (-) anginanegative cardio ROS   Rhythm:Regular Rate:Normal     Neuro/Psych Anxiety Depression negative neurological ROS     GI/Hepatic GERD  Controlled,Elevated LFTs   Endo/Other  Morbid obesity  Renal/GU negative Renal ROS     Musculoskeletal   Abdominal (+) + obese,   Peds  Hematology negative hematology ROS (+)   Anesthesia Other Findings   Reproductive/Obstetrics                            Anesthesia Physical Anesthesia Plan  ASA: 3  Anesthesia Plan: General   Post-op Pain Management:    Induction: Intravenous  PONV Risk Score and Plan: 3 and Ondansetron, Dexamethasone and Scopolamine patch - Pre-op  Airway Management Planned: Oral ETT  Additional Equipment: None  Intra-op Plan:   Post-operative Plan: Extubation in OR  Informed Consent: I have reviewed the patients History and Physical, chart, labs and discussed the procedure including the risks, benefits and alternatives for the proposed anesthesia with the patient or authorized representative who has indicated his/her understanding and acceptance.     Dental advisory given  Plan Discussed with: CRNA and Surgeon  Anesthesia Plan Comments:        Anesthesia Quick Evaluation

## 2020-08-03 NOTE — Anesthesia Procedure Notes (Signed)
Procedure Name: Intubation Date/Time: 08/03/2020 11:01 AM Performed by: Raenette Rover, CRNA Pre-anesthesia Checklist: Patient identified, Emergency Drugs available, Suction available and Patient being monitored Patient Re-evaluated:Patient Re-evaluated prior to induction Oxygen Delivery Method: Circle system utilized Preoxygenation: Pre-oxygenation with 100% oxygen Induction Type: IV induction Ventilation: Mask ventilation without difficulty Laryngoscope Size: Mac and 3 Grade View: Grade I Tube type: Oral Tube size: 7.0 mm Number of attempts: 1 Airway Equipment and Method: Stylet Placement Confirmation: ETT inserted through vocal cords under direct vision, positive ETCO2 and breath sounds checked- equal and bilateral Secured at: 23 cm Tube secured with: Tape Dental Injury: Teeth and Oropharynx as per pre-operative assessment

## 2020-08-03 NOTE — Progress Notes (Signed)
PROGRESS NOTE  Sydney Gonzalez PRF:[id number] DOB: 1985/12/02   PCP: Patient, No Pcp Per (Inactive)  Patient is from: Home  DOA: 07/31/2020 LOS: 2  Chief complaints:  Chief Complaint  Patient presents with   Abdominal Pain     Brief Narrative / Interim history: 35 year old F with PMH of anxiety, depression, cesarean section and morbid obesity presenting with pain across upper abdomen and admitted for acute symptomatic cholelithiasis, extrahepatic biliary dilation with possible choledocholithiasis as noted on RUQ [place] and MRCP.  She underwent ERCP that showed papillary edema but no choledocholithiasis noted on ERCP.  She had a sphincterotomy, then lap chole on 7/22.  Subjective: Seen and examined after she returned from lap chole.  Had an emesis after chicken broth.Pain fairly controlled.  She rates the pain 2/10.  Eager to go home.  Objective: Vitals:   08/03/20 1406 08/03/20 1500 08/03/20 1610 08/03/20 1700  BP: (!) 135/96 139/89 137/83 132/80  Pulse: 71 66 72 70  Resp: 16 18 17 18   Temp: 98.1 F (36.7 C) 97.9 F (36.6 C) 98 F (36.7 C) 98.5 F (36.9 C)  TempSrc: Oral Oral Oral Oral  SpO2: 97% 95% 96% 99%  Weight:      Height:        Intake/Output Summary (Last 24 hours) at 08/03/2020 2017 Last data filed at 08/03/2020 1800 Gross per 24 hour  Intake 1686 ml  Output 1595 ml  Net 91 ml   Filed Weights   07/31/20 1707 08/02/20 0704  Weight: 121.6 kg 121.6 kg    Examination:  GENERAL: No apparent distress.  Nontoxic. HEENT: MMM.  Vision and hearing grossly intact.  NECK: Supple.  No apparent JVD.  RESP:  No IWOB.  Fair aeration bilaterally. CVS:  RRR. Heart sounds normal.  ABD/GI/GU: BS+. Abd soft.  Mild tenderness to palpation. MSK/EXT:  Moves extremities. No apparent deformity. No edema.  SKIN: no apparent skin lesion or wound NEURO: Awake and alert. Oriented appropriately.  No apparent focal neuro deficit. PSYCH: Calm. Normal affect.   Procedures:   7/21-ERCP with sphincterectomy.  Microbiology summarized: COVID-19 and influenza PCR nonreactive.  Assessment & Plan: Symptomatic cholelithiasis with extrahepatic biliary dilation and possible choledocholithiasis -7/21-ERCP showed papillary edema but no Moquin.  She might have further Hagedorn -Lap chole on 7/22.  General surgery recommended overnight observation  Abdominal pain/elevated liver enzymes/hyperbilirubinemia-likely due to the above.  LFT improved Recent Labs  Lab 07/31/20 1735 08/01/20 0956 08/02/20 0430 08/03/20 0357  AST 347* 573* 203* 71*  ALT 265* 557* 382* 270*  ALKPHOS 63 105 97 93  BILITOT 2.7* 4.8* 1.6* 1.1  PROT 7.4 8.0 6.6 6.9  ALBUMIN 4.5 4.4 3.8 3.7  -Recheck LFT in the morning  Anxiety and depression: Stable -Continue Zoloft  Morbid obesity Body mass index is 43.26 kg/m.  -Encourage lifestyle change to lose weight       DVT prophylaxis:  SCDs Start: 08/01/20 1122 Place and maintain sequential compression device Start: 08/01/20 1104  Code Status: Full code Family Communication: Patient and/or RN.  Updated patient's husband at bedside Level of care: Med-Surg Status is: Inpatient  Remains inpatient appropriate because:Inpatient level of care appropriate due to severity of illness  Dispo: The patient is from: Home              Anticipated d/c is to: Home              Patient currently is not medically stable to d/c.   Difficult to place  patient No       Consultants:  Gastroenterology-signed off General surgery   Sch Meds:  Scheduled Meds:  [START ON 08/04/2020] acetaminophen  1,000 mg Oral On Call to OR   oxyCODONE       sertraline  50 mg Oral Daily   Continuous Infusions:  sodium chloride 75 mL/hr at 08/01/20 2203   lactated ringers 10 mL/hr at 08/03/20 1051   lactated ringers 10 mL/hr at 08/02/20 1259   PRN Meds:.acetaminophen, loratadine, morphine injection, ondansetron **OR** ondansetron (ZOFRAN) IV,  oxyCODONE  Antimicrobials: Anti-infectives (From admission, onward)    Start     Dose/Rate Route Frequency Ordered Stop   08/03/20 0600  cefTRIAXone (ROCEPHIN) 2 g in sodium chloride 0.9 % 100 mL IVPB        2 g 200 mL/hr over 30 Minutes Intravenous On call to O.R. 08/02/20 1229 08/03/20 1106   08/02/20 0700  ciprofloxacin (CIPRO) IVPB 400 mg        400 mg 200 mL/hr over 60 Minutes Intravenous  Once 08/02/20 0653 08/02/20 0828        I have personally reviewed the following labs and images: CBC: Recent Labs  Lab 07/31/20 1735 08/01/20 0956 08/03/20 0357  WBC 10.0 5.6 10.9*  HGB 14.0 14.5 13.2  HCT 43.0 44.4 40.3  MCV 87.6 89.2 89.2  PLT 329 291 308   BMP &GFR Recent Labs  Lab 07/31/20 1735 08/01/20 0956 08/02/20 0430 08/03/20 0357  NA 142 143 141 139  K 4.1 3.9 3.7 3.8  CL 106 105 109 112*  CO2 27 24 21* 21*  GLUCOSE 116* 88 87 97  BUN 8 9 8 7   CREATININE 0.71 0.58 0.67 0.82  CALCIUM 9.2 9.4 8.6* 8.6*   Estimated Creatinine Clearance: 127.3 mL/min (by C-G formula based on SCr of 0.82 mg/dL). Liver & Pancreas: Recent Labs  Lab 07/31/20 1735 08/01/20 0956 08/02/20 0430 08/03/20 0357  AST 347* 573* 203* 71*  ALT 265* 557* 382* 270*  ALKPHOS 63 105 97 93  BILITOT 2.7* 4.8* 1.6* 1.1  PROT 7.4 8.0 6.6 6.9  ALBUMIN 4.5 4.4 3.8 3.7   Recent Labs  Lab 07/31/20 1735 08/02/20 0430  LIPASE 23 32   No results for input(s): AMMONIA in the last 168 hours. Diabetic: No results for input(s): HGBA1C in the last 72 hours. No results for input(s): GLUCAP in the last 168 hours. Cardiac Enzymes: No results for input(s): CKTOTAL, CKMB, CKMBINDEX, TROPONINI in the last 168 hours. No results for input(s): PROBNP in the last 8760 hours. Coagulation Profile: No results for input(s): INR, PROTIME in the last 168 hours. Thyroid Function Tests: No results for input(s): TSH, T4TOTAL, FREET4, T3FREE, THYROIDAB in the last 72 hours. Lipid Profile: No results for  input(s): CHOL, HDL, LDLCALC, TRIG, CHOLHDL, LDLDIRECT in the last 72 hours. Anemia Panel: No results for input(s): VITAMINB12, FOLATE, FERRITIN, TIBC, IRON, RETICCTPCT in the last 72 hours. Urine analysis:    Component Value Date/Time   COLORURINE YELLOW 07/31/2020 1735   APPEARANCEUR CLEAR 07/31/2020 1735   LABSPEC 1.035 (H) 07/31/2020 1735   PHURINE 6.5 07/31/2020 1735   GLUCOSEU NEGATIVE 07/31/2020 1735   HGBUR SMALL (A) 07/31/2020 1735   BILIRUBINUR MODERATE (A) 07/31/2020 1735   KETONESUR NEGATIVE 07/31/2020 1735   PROTEINUR TRACE (A) 07/31/2020 1735   NITRITE NEGATIVE 07/31/2020 1735   LEUKOCYTESUR NEGATIVE 07/31/2020 1735   Sepsis Labs: Invalid input(s): PROCALCITONIN, LACTICIDVEN  Microbiology: Recent Results (from the past 240 hour(s))  Resp  Panel by RT-PCR (Flu A&B, Covid) Nasopharyngeal Swab     Status: None   Collection Time: 08/01/20 12:19 AM   Specimen: Nasopharyngeal Swab; Nasopharyngeal(NP) swabs in vial transport medium  Result Value Ref Range Status   SARS Coronavirus 2 by RT PCR NEGATIVE NEGATIVE Final    Comment: (NOTE) SARS-CoV-2 target nucleic acids are NOT DETECTED.  The SARS-CoV-2 RNA is generally detectable in upper respiratory specimens during the acute phase of infection. The lowest concentration of SARS-CoV-2 viral copies this assay can detect is 138 copies/mL. A negative result does not preclude SARS-Cov-2 infection and should not be used as the sole basis for treatment or other patient management decisions. A negative result may occur with  improper specimen collection/handling, submission of specimen other than nasopharyngeal swab, presence of viral mutation(s) within the areas targeted by this assay, and inadequate number of viral copies(<138 copies/mL). A negative result must be combined with clinical observations, patient history, and epidemiological information. The expected result is Negative.  Fact Sheet for Patients:   BloggerCourse.com  Fact Sheet for Healthcare Providers:  SeriousBroker.it  This test is no t yet approved or cleared by the [place] FDA and  has been authorized for detection and/or diagnosis of SARS-CoV-2 by FDA under an Emergency Use Authorization (EUA). This EUA will remain  in effect (meaning this test can be used) for the duration of the COVID-19 declaration under Section 564(b)(1) of the Act, 21 U.S.C.section 360bbb-3(b)(1), unless the authorization is terminated  or revoked sooner.       Influenza A by PCR NEGATIVE NEGATIVE Final   Influenza B by PCR NEGATIVE NEGATIVE Final    Comment: (NOTE) The Xpert Xpress SARS-CoV-2/FLU/RSV plus assay is intended as an aid in the diagnosis of influenza from Nasopharyngeal swab specimens and should not be used as a sole basis for treatment. Nasal washings and aspirates are unacceptable for Xpert Xpress SARS-CoV-2/FLU/RSV testing.  Fact Sheet for Patients: BloggerCourse.com  Fact Sheet for Healthcare Providers: SeriousBroker.it  This test is not yet approved or cleared by the [place] FDA and has been authorized for detection and/or diagnosis of SARS-CoV-2 by FDA under an Emergency Use Authorization (EUA). This EUA will remain in effect (meaning this test can be used) for the duration of the COVID-19 declaration under Section 564(b)(1) of the Act, 21 U.S.C. section 360bbb-3(b)(1), unless the authorization is terminated or revoked.  Performed at [facility], [address], [place], [place] [address]   Surgical pcr screen     Status: None   Collection Time: 08/02/20 10:56 PM   Specimen: Nasal Mucosa; Nasal Swab  Result Value Ref Range Status   MRSA, PCR NEGATIVE NEGATIVE Final   Staphylococcus aureus NEGATIVE NEGATIVE Final    Comment: (NOTE) The Xpert SA Assay (FDA approved for NASAL specimens in  patients 21 years of age and older), is one component of a comprehensive surveillance program. It is not intended to diagnose infection nor to guide or monitor treatment. Performed at [facility], [address], [place], [place] [address]     Radiology Studies: No results found.     Mackenize Delgadillo T. Poppi Scantling [facility]  If 7PM-7AM, please contact night-coverage [email or web address] 08/03/2020, 8:17 PM

## 2020-08-03 NOTE — Interval H&P Note (Signed)
History and Physical Interval Note:  08/03/2020 10:41 AM  Sydney Gonzalez  has presented today for surgery, with the diagnosis of cholecystitis, cholelithiasis.  The various methods of treatment have been discussed with the patient and family. After consideration of risks, benefits and other options for treatment, the patient has consented to  Procedure(s): LAPAROSCOPIC CHOLECYSTECTOMY WITH INTRAOPERATIVE CHOLANGIOGRAM (N/A) as a surgical intervention.  The patient's history has been reviewed, patient examined, no change in status, stable for surgery.  I have reviewed the patient's chart and labs.  Questions were answered to the patient's satisfaction.     Chevis Pretty III

## 2020-08-03 NOTE — Progress Notes (Signed)
Patient not seen as she was in the surgical unit for laparoscopic cholecystectomy.  ERCP was performed yesterday 08/02/20  The major papilla was congested. No obvious Koral was seen on initial cholangiogram; biliary sphincterotomy performed.  Nothing was found on multiple balloon pull-through's but dirty bile was flowing so quickly that we could have easily miss seeing the Steeber pass through the widely patent sphincterotomy site and an occlusion cholangiogram did not reveal any residual abnormalities.  T. Bili has normalized, now 1.1.  AST/ALT downtrending: AST 71, ALT 270.   Mild leukocytosis (10.9).  [facility] will sign off.  Further management as per [facility].  Please contact us if we can be of any further assistance during this hospital stay.

## 2020-08-03 NOTE — Anesthesia Postprocedure Evaluation (Signed)
Anesthesia Post Note  Patient: Sydney Gonzalez  Procedure(s) Performed: LAPAROSCOPIC CHOLECYSTECTOMY WITH INTRAOPERATIVE CHOLANGIOGRAM     Patient location during evaluation: PACU Anesthesia Type: General Level of consciousness: awake and alert Pain management: pain level controlled Vital Signs Assessment: post-procedure vital signs reviewed and stable Respiratory status: spontaneous breathing, nonlabored ventilation, respiratory function stable and patient connected to nasal cannula oxygen Cardiovascular status: blood pressure returned to baseline and stable Postop Assessment: no apparent nausea or vomiting Anesthetic complications: no   No notable events documented.  Last Vitals:  Vitals:   08/03/20 1330 08/03/20 1406  BP: 132/83 (!) 135/96  Pulse: 82 71  Resp: 12 16  Temp:  36.7 C  SpO2: 98% 97%    Last Pain:  Vitals:   08/03/20 1406  TempSrc: Oral  PainSc:                  Shelton Silvas

## 2020-08-03 NOTE — Discharge Instructions (Signed)
[facility]  Please arrive at least 30 min before your appointment to complete your check in paperwork.  If you are unable to arrive 30 min prior to your appointment time we may have to cancel or reschedule you. LAPAROSCOPIC SURGERY: POST OP INSTRUCTIONS Always review your discharge instruction sheet given to you by the facility where your surgery was performed. IF YOU HAVE DISABILITY OR FAMILY LEAVE FORMS, YOU MUST BRING THEM TO THE OFFICE FOR PROCESSING.   DO NOT GIVE THEM TO YOUR DOCTOR.  PAIN CONTROL  First take acetaminophen (Tylenol) AND/or ibuprofen (Advil) to control your pain after surgery.  Follow directions on package.  Taking acetaminophen (Tylenol) and/or ibuprofen (Advil) regularly after surgery will help to control your pain and lower the amount of prescription pain medication you may need.  You should not take more than 4,000 mg (4 grams) of acetaminophen (Tylenol) in 24 hours.  You should not take ibuprofen (Advil), aleve, motrin, naprosyn or other NSAIDS if you have a history of stomach ulcers or chronic kidney disease.  A prescription for pain medication may be given to you upon discharge.  Take your pain medication as prescribed, if you still have uncontrolled pain after taking acetaminophen (Tylenol) or ibuprofen (Advil). Use ice packs to help control pain. If you need a refill on your pain medication, please contact your pharmacy.  They will contact our office to request authorization. Prescriptions will not be filled after 5pm or on week-ends.  HOME MEDICATIONS Take your usually prescribed medications unless otherwise directed.  DIET You should follow a light diet the first few days after arrival home.  Be sure to include lots of fluids daily. Avoid fatty, fried foods.   CONSTIPATION It is common to experience some constipation after surgery and if you are taking pain medication.  Increasing fluid intake and taking a stool softener (such as Colace)  will usually help or prevent this problem from occurring.  A mild laxative (Milk of Magnesia or Miralax) should be taken according to package instructions if there are no bowel movements after 48 hours.  WOUND/INCISION CARE Most patients will experience some swelling and bruising in the area of the incisions.  Ice packs will help.  Swelling and bruising can take several days to resolve.  Unless discharge instructions indicate otherwise, follow guidelines below  STERI-STRIPS - you may remove your outer bandages 48 hours after surgery, and you may shower at that time.  You have steri-strips (small skin tapes) in place directly over the incision.  These strips should be left on the skin for 7-10 days.   DERMABOND/SKIN GLUE - you may shower in 24 hours.  The glue will flake off over the next 2-3 weeks. Any sutures or staples will be removed at the office during your follow-up visit.  ACTIVITIES You may resume regular (light) daily activities beginning the next day--such as daily self-care, walking, climbing stairs--gradually increasing activities as tolerated.  You may have sexual intercourse when it is comfortable.  Refrain from any heavy lifting or straining until approved by your doctor. You may drive when you are no longer taking prescription pain medication, you can comfortably wear a seatbelt, and you can safely maneuver your car and apply brakes.  FOLLOW-UP You should see your doctor in the office for a follow-up appointment approximately 2-3 weeks after your surgery.  You should have been given your post-op/follow-up appointment when your surgery was scheduled.  If you did not receive a post-op/follow-up appointment, make sure   that you call for this appointment within a day or two after you arrive home to insure a convenient appointment time.   WHEN TO CALL YOUR DOCTOR: Fever over 101.0 Inability to urinate Continued bleeding from incision. Increased pain, redness, or drainage from the  incision. Increasing abdominal pain  The clinic staff is available to answer your questions during regular business hours.  Please don't hesitate to call and ask to speak to one of the nurses for clinical concerns.  If you have a medical emergency, go to the nearest emergency room or call 911.  A surgeon from [facility] is always on call at the hospital. [address], [address], [place], [place]  [address] ? [address] [address], [place], [place]   [address] [phone] ? [phone] ? FAX [phone]  

## 2020-08-04 ENCOUNTER — Encounter (HOSPITAL_COMMUNITY): Payer: Self-pay | Admitting: General Surgery

## 2020-08-04 DIAGNOSIS — K8051 Calculus of bile duct without cholangitis or cholecystitis with obstruction: Secondary | ICD-10-CM | POA: Diagnosis not present

## 2020-08-04 DIAGNOSIS — F419 Anxiety disorder, unspecified: Secondary | ICD-10-CM | POA: Diagnosis not present

## 2020-08-04 DIAGNOSIS — R748 Abnormal levels of other serum enzymes: Secondary | ICD-10-CM | POA: Diagnosis not present

## 2020-08-04 LAB — COMPREHENSIVE METABOLIC PANEL
ALT: 197 U/L — ABNORMAL HIGH (ref 0–44)
AST: 43 U/L — ABNORMAL HIGH (ref 15–41)
Albumin: 3.7 g/dL (ref 3.5–5.0)
Alkaline Phosphatase: 84 U/L (ref 38–126)
Anion gap: 7 (ref 5–15)
BUN: 11 mg/dL (ref 6–20)
CO2: 24 mmol/L (ref 22–32)
Calcium: 9.2 mg/dL (ref 8.9–10.3)
Chloride: 109 mmol/L (ref 98–111)
Creatinine, Ser: 0.69 mg/dL (ref 0.44–1.00)
GFR, Estimated: >60 mL/min (ref >60)
Glucose, Bld: 99 mg/dL (ref 70–99)
Potassium: 3.7 mmol/L (ref 3.5–5.1)
Sodium: 140 mmol/L (ref 135–145)
Total Bilirubin: 0.8 mg/dL (ref 0.3–1.2)
Total Protein: 6.9 g/dL (ref 6.5–8.1)

## 2020-08-04 NOTE — Discharge Summary (Signed)
Physician Discharge Summary  Sydney Gonzalez YSA:[id number] DOB: May 10, 1985 DOA: 07/31/2020  PCP: Patient, No Pcp Per (Inactive)  Admit date: 07/31/2020 Discharge date: 08/04/2020  Admitted From: Home Disposition: Home  Recommendations for Outpatient Follow-up:  Follow ups as below. Please obtain CBC/CMP/Mag at follow up Please follow up on the following pending results: None  Home Health: Not indicated Equipment/Devices: Not indicated  Discharge Condition: Stable CODE STATUS: Full code   Follow-up Information     [facility], [facility] Follow up on 08/21/2020.   Specialty: General Surgery Why: 08/21/20 at 10:30am. Please arrive 30 minutes prior to your appointment for paperwork. Please bring a copy of your photo ID and insurance card. Contact information: [address] [place] [address] [phone]                  Hospital Course: 35 year old F with PMH of anxiety, depression, cesarean section and morbid obesity presenting with pain across upper abdomen and admitted for acute symptomatic cholelithiasis, extrahepatic biliary dilation with possible choledocholithiasis as noted on RUQ [place] and MRCP.  She underwent ERCP with sphincterectomy.  No choledocholithiasis noted on ERCP. she underwent lap chole on 7/22.  Remained stable and cleared for discharge by general surgery for outpatient follow-up.  See individual from list below for more on hospital course.  Discharge Diagnoses:  Symptomatic cholelithiasis with extrahepatic biliary dilation and possible choledocholithiasis -7/21-ERCP showed papillary edema but no choledocholithiasis.  She might have passed the Kawasaki. -Lap chole on 7/22.  -Symptoms resolved. -Outpatient follow-up with general surgery -Recommend repeat CMP at follow-up   Abdominal pain/elevated liver enzymes/hyperbilirubinemia-likely due to the above.   -Recommend repeat CMP at follow-up   Anxiety and depression:  Stable -Continue Zoloft   Morbid obesity Body mass index is 43.26 kg/m.  -Encourage lifestyle change to lose weight.          Discharge Exam: Vitals:   08/04/20 0218 08/04/20 0600  BP: 123/70 125/77  Pulse: (!) 54 62  Resp: 16 16  Temp:  98 F (36.7 C)  SpO2: 93% 96%    GENERAL: No apparent distress.  Nontoxic. HEENT: MMM.  Vision and hearing grossly intact.  NECK: Supple.  No apparent JVD.  RESP: On RA.  No IWOB.  Fair aeration bilaterally. CVS:  RRR. Heart sounds normal.  ABD/GI/GU: Bowel sounds present. Soft.  Slightly tender MSK/EXT:  Moves extremities. No apparent deformity. No edema.  SKIN: no apparent skin lesion or wound NEURO: Awake, alert and oriented appropriately.  No apparent focal neuro deficit. PSYCH: Calm. Normal affect.  Discharge Instructions  Discharge Instructions     Call MD for:  difficulty breathing, headache or visual disturbances   Complete by: As directed    Call MD for:  extreme fatigue   Complete by: As directed    Call MD for:  persistant nausea and vomiting   Complete by: As directed    Call MD for:  redness, tenderness, or signs of infection (pain, swelling, redness, odor or green/yellow discharge around incision site)   Complete by: As directed    Call MD for:  severe uncontrolled pain   Complete by: As directed    Call MD for:  temperature >100.4   Complete by: As directed    Diet general   Complete by: As directed    Discharge instructions   Complete by: As directed    It has been a pleasure taking care of you!  You were hospitalized due to abdominal  pain from gallstone.  You were treated surgically, and your symptoms improved to the point we think it safe to let you go home and follow-up with your doctors.  Avoid lifting heavy weight or extraneous activity over the next few days.  Minimize fatty or greasy foods.  Follow-up with your primary care doctor in 1 to 2 weeks to have your liver enzymes rechecked.  Follow-up with your  surgeon as recommended to you.   Take care,   Increase activity slowly   Complete by: As directed    No wound care   Complete by: As directed       Allergies as of 08/04/2020   No Known Allergies      Medication List     TAKE these medications    cetirizine 10 MG tablet Commonly known as: ZYRTEC Take 10 mg by mouth daily.   multivitamin-prenatal 27-0.8 MG Tabs tablet Take 1 tablet by mouth daily at 12 noon.   naproxen sodium 220 MG tablet Commonly known as: ALEVE Take 440 mg by mouth 2 (two) times daily as needed (headache/pain/fever).   sertraline 50 MG tablet Commonly known as: ZOLOFT Take 50 mg by mouth daily.        Consultations: Gastroenterology General surgery  Procedures/Studies: 7/21-ERCP with sphincterectomy. 7/22-laparoscopic cholecystectomy   MR 3D Recon At Scanner  Result Date: 08/01/2020 CLINICAL DATA:  Right upper quadrant abdominal pain and jaundice. Abnormal ultrasound. EXAM: MRI ABDOMEN WITHOUT AND WITH CONTRAST (INCLUDING MRCP) TECHNIQUE: Multiplanar multisequence MR imaging of the abdomen was performed both before and after the administration of intravenous contrast. Heavily T2-weighted images of the biliary and pancreatic ducts were obtained, and three-dimensional MRCP images were rendered by post processing. CONTRAST:  10mL GADAVIST GADOBUTROL 1 MMOL/ML IV SOLN COMPARISON:  Ultrasound 07/31/2020 FINDINGS: Despite efforts by the technologist and patient, mild motion artifact is present on today's exam and could not be eliminated. This reduces exam sensitivity and specificity. Lower chest: Trace pleural effusions and mild atelectasis at both lung bases. Hepatobiliary: There is loss of signal in the liver on the gradient echo opposed phase images consistent with steatosis. No focal lesion or abnormal enhancement identified. As seen on ultrasound, there are multiple gallstones. No evidence of gallbladder wall thickening or surrounding inflammation.  No significant residual intrahepatic or extrahepatic biliary dilatation. The common hepatic duct measures 6 mm in diameter (9 mm on ultrasound). Equivocal findings for a 2-3 mm Bouley in the distal common bile duct (best seen on image 20/8 and 1/14). Pancreas: Unremarkable. No pancreatic ductal dilatation or surrounding inflammatory changes. Spleen: Normal in size without focal abnormality. Adrenals/Urinary Tract: Both adrenal glands appear normal. Both kidneys appear normal without evidence of hydronephrosis or mass lesion. Stomach/Bowel: The stomach appears unremarkable for its degree of distension. No evidence of bowel wall thickening, distention or surrounding inflammatory change. Vascular/Lymphatic: There are no enlarged abdominal lymph nodes. No significant vascular findings. Other: No evidence of abdominal wall hernia or ascites. Musculoskeletal: No acute or significant osseous findings. IMPRESSION: 1. Cholelithiasis without evidence of gallbladder wall thickening. 2. Extrahepatic biliary dilatation has improved compared with recent ultrasound. This suggests possible interval passage of a common duct Lattanzio. There is a possible 2-3 mm residual Bargo in the distal common bile duct. 3. No evidence of pancreatitis. 4. Hepatic steatosis. Electronically Signed   By: Carey Bullocks M.D.   On: 08/01/2020 14:29   DG ERCP BILIARY & PANCREATIC DUCTS  Result Date: 08/02/2020 CLINICAL DATA:  Cholelithiasis, concern for choledocholithiasis EXAM: ERCP  with sphincterotomy and balloon sweep TECHNIQUE: Multiple spot images obtained with the fluoroscopic device and submitted for interpretation post-procedure. FLUOROSCOPY TIME:  Fluoroscopy Time:  1 minutes 49 seconds Number of Acquired Spot Images: Several spot fluoroscopic views COMPARISON:  08/01/2020 MR FINDINGS: Limited spot fluoroscopic views during the ERCP demonstrate retrograde guidewire access and contrast injection into the biliary tree. Mild CBD dilatation.  Balloon sweep performed. No large filling defects appreciated or obstruction pattern. Intrahepatic ducts appear nondilated. IMPRESSION: Successful ERCP with CBD balloon sweep. These images were submitted for radiologic interpretation only. Please see the procedural report for the amount of contrast and the fluoroscopy time utilized. Electronically Signed   By: Judie Petit.  Shick M.D.   On: 08/02/2020 08:52   MR ABDOMEN MRCP W WO CONTAST  Result Date: 08/01/2020 CLINICAL DATA:  Right upper quadrant abdominal pain and jaundice. Abnormal ultrasound. EXAM: MRI ABDOMEN WITHOUT AND WITH CONTRAST (INCLUDING MRCP) TECHNIQUE: Multiplanar multisequence MR imaging of the abdomen was performed both before and after the administration of intravenous contrast. Heavily T2-weighted images of the biliary and pancreatic ducts were obtained, and three-dimensional MRCP images were rendered by post processing. CONTRAST:  5mL GADAVIST GADOBUTROL 1 MMOL/ML IV SOLN COMPARISON:  Ultrasound 07/31/2020 FINDINGS: Despite efforts by the technologist and patient, mild motion artifact is present on today's exam and could not be eliminated. This reduces exam sensitivity and specificity. Lower chest: Trace pleural effusions and mild atelectasis at both lung bases. Hepatobiliary: There is loss of signal in the liver on the gradient echo opposed phase images consistent with steatosis. No focal lesion or abnormal enhancement identified. As seen on ultrasound, there are multiple gallstones. No evidence of gallbladder wall thickening or surrounding inflammation. No significant residual intrahepatic or extrahepatic biliary dilatation. The common hepatic duct measures 6 mm in diameter (9 mm on ultrasound). Equivocal findings for a 2-3 mm Jilek in the distal common bile duct (best seen on image 20/8 and 1/14). Pancreas: Unremarkable. No pancreatic ductal dilatation or surrounding inflammatory changes. Spleen: Normal in size without focal abnormality.  Adrenals/Urinary Tract: Both adrenal glands appear normal. Both kidneys appear normal without evidence of hydronephrosis or mass lesion. Stomach/Bowel: The stomach appears unremarkable for its degree of distension. No evidence of bowel wall thickening, distention or surrounding inflammatory change. Vascular/Lymphatic: There are no enlarged abdominal lymph nodes. No significant vascular findings. Other: No evidence of abdominal wall hernia or ascites. Musculoskeletal: No acute or significant osseous findings. IMPRESSION: 1. Cholelithiasis without evidence of gallbladder wall thickening. 2. Extrahepatic biliary dilatation has improved compared with recent ultrasound. This suggests possible interval passage of a common duct Lave. There is a possible 2-3 mm residual Cobern in the distal common bile duct. 3. No evidence of pancreatitis. 4. Hepatic steatosis. Electronically Signed   By: Carey Bullocks M.D.   On: 08/01/2020 14:29   US Abdomen Limited RUQ (LIVER/GB)  Result Date: 07/31/2020 CLINICAL DATA:  Right upper quadrant abdominal pain EXAM: ULTRASOUND ABDOMEN LIMITED RIGHT UPPER QUADRANT COMPARISON:  None. FINDINGS: Gallbladder: Multiple shadowing gallstones are seen centrally. The gallbladder is not distended. There is no gallbladder wall thickening or pericholecystic fluid. The sonographic Eulah Pont sign is reportedly positive. Common bile duct: Diameter: The proximal extrahepatic bile duct is dilated measuring 9 mm in diameter, best seen on image # 31 Liver: The hepatic parenchyma is diffusely echogenic and there is poor acoustic through transmission in keeping with changes of at least moderate hepatic steatosis. This limits the sensitivity for detection of focal intrahepatic masses though none are identified.  There is no definite intrahepatic biliary ductal dilation. Portal vein is patent on color Doppler imaging with normal direction of blood flow towards the liver. Other: None. IMPRESSION: Cholelithiasis.  Reportedly positive sonographic Eulah Pont sign is equivocal given the otherwise benign appearance of the gallbladder. Dilation of the extrahepatic bile duct. A distal obstructing lesion, such as choledocholithiasis, is not excluded. ERCP or MRCP examination would be helpful for further evaluation if there is clinical suspicion of a biliary obstructive process. Moderate hepatic steatosis. Electronically Signed   By: Helyn Numbers MD   On: 07/31/2020 23:30       The results of significant diagnostics from this hospitalization (including imaging, microbiology, ancillary and laboratory) are listed below for reference.     Microbiology: Recent Results (from the past 240 hour(s))  Resp Panel by RT-PCR (Flu A&B, Covid) Nasopharyngeal Swab     Status: None   Collection Time: 08/01/20 12:19 AM   Specimen: Nasopharyngeal Swab; Nasopharyngeal(NP) swabs in vial transport medium  Result Value Ref Range Status   SARS Coronavirus 2 by RT PCR NEGATIVE NEGATIVE Final    Comment: (NOTE) SARS-CoV-2 target nucleic acids are NOT DETECTED.  The SARS-CoV-2 RNA is generally detectable in upper respiratory specimens during the acute phase of infection. The lowest concentration of SARS-CoV-2 viral copies this assay can detect is 138 copies/mL. A negative result does not preclude SARS-Cov-2 infection and should not be used as the sole basis for treatment or other patient management decisions. A negative result may occur with  improper specimen collection/handling, submission of specimen other than nasopharyngeal swab, presence of viral mutation(s) within the areas targeted by this assay, and inadequate number of viral copies(<138 copies/mL). A negative result must be combined with clinical observations, patient history, and epidemiological information. The expected result is Negative.  Fact Sheet for Patients:  BloggerCourse.com  Fact Sheet for Healthcare Providers:   SeriousBroker.it  This test is no t yet approved or cleared by the [place] FDA and  has been authorized for detection and/or diagnosis of SARS-CoV-2 by FDA under an Emergency Use Authorization (EUA). This EUA will remain  in effect (meaning this test can be used) for the duration of the COVID-19 declaration under Section 564(b)(1) of the Act, 21 U.S.C.section 360bbb-3(b)(1), unless the authorization is terminated  or revoked sooner.       Influenza A by PCR NEGATIVE NEGATIVE Final   Influenza B by PCR NEGATIVE NEGATIVE Final    Comment: (NOTE) The Xpert Xpress SARS-CoV-2/FLU/RSV plus assay is intended as an aid in the diagnosis of influenza from Nasopharyngeal swab specimens and should not be used as a sole basis for treatment. Nasal washings and aspirates are unacceptable for Xpert Xpress SARS-CoV-2/FLU/RSV testing.  Fact Sheet for Patients: BloggerCourse.com  Fact Sheet for Healthcare Providers: SeriousBroker.it  This test is not yet approved or cleared by the [place] FDA and has been authorized for detection and/or diagnosis of SARS-CoV-2 by FDA under an Emergency Use Authorization (EUA). This EUA will remain in effect (meaning this test can be used) for the duration of the COVID-19 declaration under Section 564(b)(1) of the Act, 21 U.S.C. section 360bbb-3(b)(1), unless the authorization is terminated or revoked.  Performed at [facility], [address] [place], [place] [address]   Surgical pcr screen     Status: None   Collection Time: 08/02/20 10:56 PM   Specimen: Nasal Mucosa; Nasal Swab  Result Value Ref Range Status   MRSA, PCR NEGATIVE NEGATIVE Final   Staphylococcus aureus  NEGATIVE NEGATIVE Final    Comment: (NOTE) The Xpert SA Assay (FDA approved for NASAL specimens in patients 36 years of age and older), is one component of a  comprehensive surveillance program. It is not intended to diagnose infection nor to guide or monitor treatment. Performed at [facility], [address] [place], [place] [id number]      Labs:  CBC: Recent Labs  Lab 07/31/20 1735 08/01/20 0956 08/03/20 0357  WBC 10.0 5.6 10.9*  HGB 14.0 14.5 13.2  HCT 43.0 44.4 40.3  MCV 87.6 89.2 89.2  PLT 329 291 308   BMP &GFR Recent Labs  Lab 07/31/20 1735 08/01/20 0956 08/02/20 0430 08/03/20 0357 08/04/20 0404  NA 142 143 141 139 140  K 4.1 3.9 3.7 3.8 3.7  CL 106 105 109 112* 109  CO2 27 24 21* 21* 24  GLUCOSE 116* 88 87 97 99  BUN 8 9 8 7 11   CREATININE 0.71 0.58 0.67 0.82 0.69  CALCIUM 9.2 9.4 8.6* 8.6* 9.2   Estimated Creatinine Clearance: 130.5 mL/min (by C-G formula based on SCr of 0.69 mg/dL). Liver & Pancreas: Recent Labs  Lab 07/31/20 1735 08/01/20 0956 08/02/20 0430 08/03/20 0357 08/04/20 0404  AST 347* 573* 203* 71* 43*  ALT 265* 557* 382* 270* 197*  ALKPHOS 63 105 97 93 84  BILITOT 2.7* 4.8* 1.6* 1.1 0.8  PROT 7.4 8.0 6.6 6.9 6.9  ALBUMIN 4.5 4.4 3.8 3.7 3.7   Recent Labs  Lab 07/31/20 1735 08/02/20 0430  LIPASE 23 32   No results for input(s): AMMONIA in the last 168 hours. Diabetic: No results for input(s): HGBA1C in the last 72 hours. No results for input(s): GLUCAP in the last 168 hours. Cardiac Enzymes: No results for input(s): CKTOTAL, CKMB, CKMBINDEX, TROPONINI in the last 168 hours. No results for input(s): PROBNP in the last 8760 hours. Coagulation Profile: No results for input(s): INR, PROTIME in the last 168 hours. Thyroid Function Tests: No results for input(s): TSH, T4TOTAL, FREET4, T3FREE, THYROIDAB in the last 72 hours. Lipid Profile: No results for input(s): CHOL, HDL, LDLCALC, TRIG, CHOLHDL, LDLDIRECT in the last 72 hours. Anemia Panel: No results for input(s): VITAMINB12, FOLATE, FERRITIN, TIBC, IRON, RETICCTPCT in the last 72 hours. Urine analysis:     Component Value Date/Time   COLORURINE YELLOW 07/31/2020 1735   APPEARANCEUR CLEAR 07/31/2020 1735   LABSPEC 1.035 (H) 07/31/2020 1735   PHURINE 6.5 07/31/2020 1735   GLUCOSEU NEGATIVE 07/31/2020 1735   HGBUR SMALL (A) 07/31/2020 1735   BILIRUBINUR MODERATE (A) 07/31/2020 1735   KETONESUR NEGATIVE 07/31/2020 1735   PROTEINUR TRACE (A) 07/31/2020 1735   NITRITE NEGATIVE 07/31/2020 1735   LEUKOCYTESUR NEGATIVE 07/31/2020 1735   Sepsis Labs: Invalid input(s): PROCALCITONIN, LACTICIDVEN   Time coordinating discharge: 35 minutes  SIGNED:  Almon Herculesaye T Maxwell Martorano, MD  [facility] 08/04/2020, 3:30 PM  If 7PM-7AM, please contact night-coverage [email or web address]

## 2020-08-06 LAB — SURGICAL PATHOLOGY

## 2020-08-07 ENCOUNTER — Encounter (HOSPITAL_COMMUNITY): Payer: Self-pay | Admitting: Gastroenterology

## 2021-11-04 IMAGING — MR MR ABDOMEN WO/W CM MRCP
18 of 20 series · 42 of 48 positions shown · IV contrast (gadavist)
Comparison: Ultrasound 07/31/2020

CLINICAL DATA: Right upper quadrant abdominal pain and jaundice.
Abnormal ultrasound.

EXAM:
MRI ABDOMEN WITHOUT AND WITH CONTRAST (INCLUDING MRCP)
TECHNIQUE: Multiplanar multisequence MR imaging of the abdomen was performed
both before and after the administration of intravenous contrast.
Heavily T2-weighted images of the biliary and pancreatic ducts were
obtained, and three-dimensional MRCP images were rendered by post
processing.
CONTRAST:  10mL GADAVIST GADOBUTROL 1 MMOL/ML IV SOLN

[Series 2: DWI · axial · 6.0mm · 1.49mm/px · z∈[-142,+154]mm · 4 of 84 slices shown (1 of 2)]
[im 1/84]
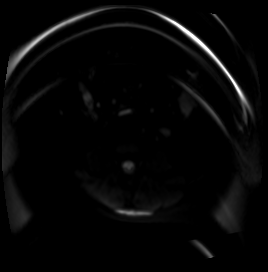
[im 28/84]
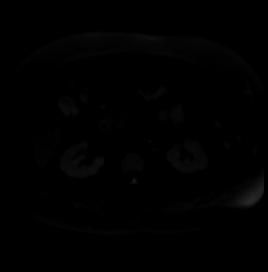
[im 56/84]
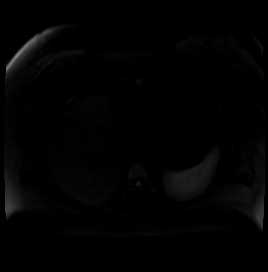
[im 84/84]
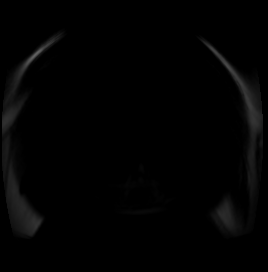

[Series 3: DWI · axial · 6.0mm · 1.49mm/px · 1 of 42 slices shown (2 of 2)]
[im 1/42]
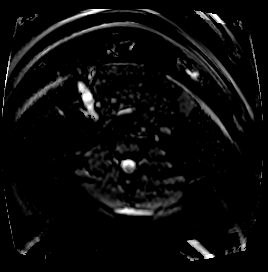

[Series 4: T2 fat-sat · axial · 6.0mm · 1.25mm/px · 1 of 42 slices shown]
[im 1/42]
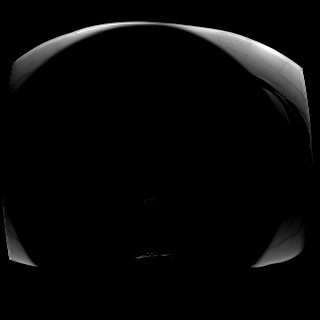

[Series 8: T2 · coronal · 6.0mm · 1.48mm/px · 1 of 42 slices shown (1 of 2)]
[im 1/42]
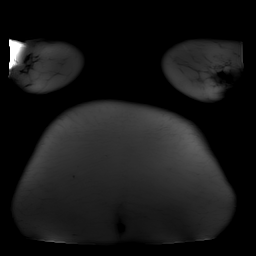

[Series 9: T1 · axial · 3.0mm · 1.25mm/px · z∈[-141,+144]mm · 3 of 96 slices shown (1 of 2)]
[im 1/96]
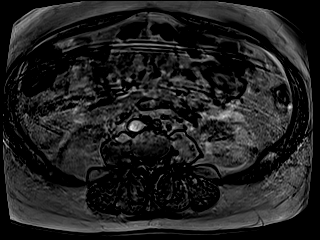
[im 48/96]
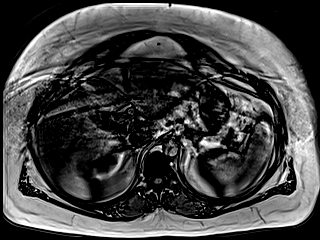
[im 96/96]
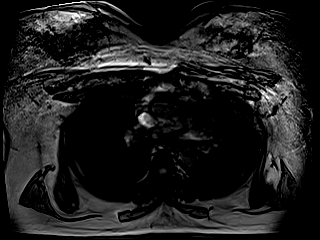

[Series 10: T1 · axial · 3.0mm · 1.25mm/px · z∈[-141,+144]mm · 3 of 96 slices shown (2 of 2)]
[im 1/96]
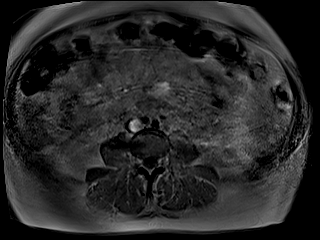
[im 48/96]
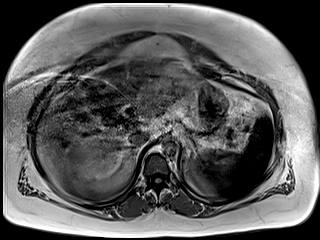
[im 96/96]
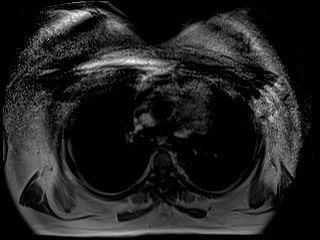

[Series 11: cor obl thk · oblique · 50.0mm · 0.78mm/px · 1 of 9 slices shown]
[im 1/9]
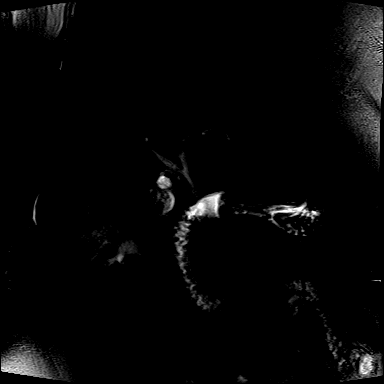

[Series 13: cor_3d_spc_trig · coronal · 1.0mm · 0.49mm/px · 2 of 68 slices shown]
[im 1/68]
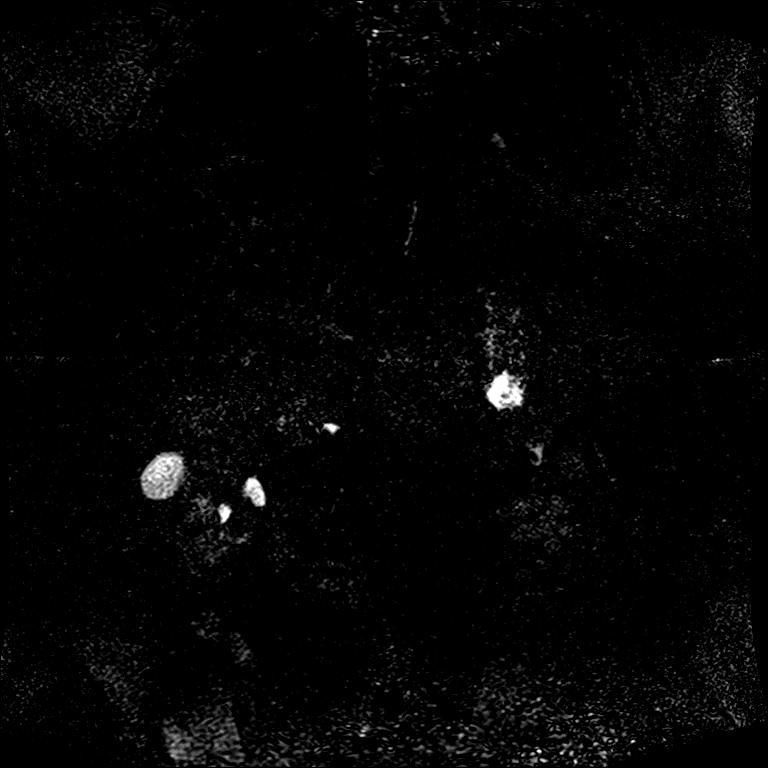
[im 68/68]
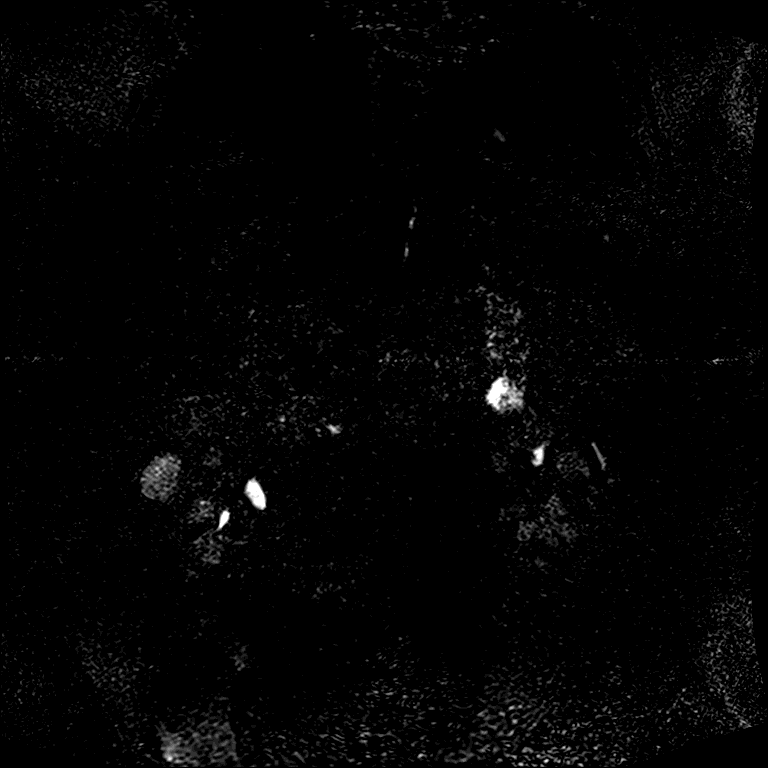

[Series 15: T2 · axial · 6.0mm · 1.56mm/px · 1 of 39 slices shown (2 of 2)]
[im 1/39]
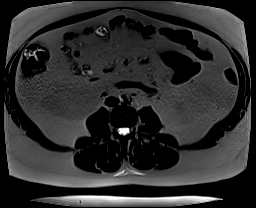

[Series 17: T1 dynamic · axial · 3.0mm · 1.25mm/px · z∈[-144,+141]mm · 3 of 96 slices shown (1 of 9)]
[im 1/96]
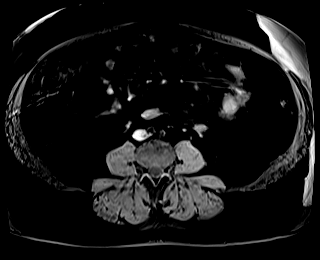
[im 48/96]
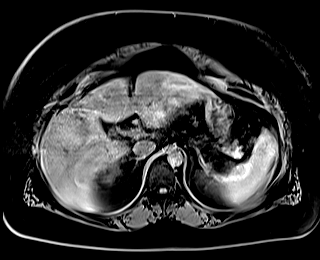
[im 96/96]
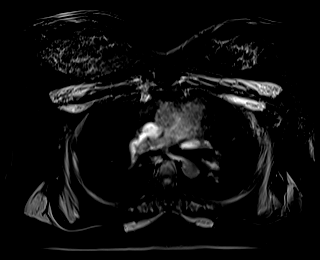

[Series 21: T1 dynamic · axial · 3.0mm · 1.25mm/px · z∈[-144,+141]mm · 3 of 96 slices shown (2 of 9)]
[im 1/96]
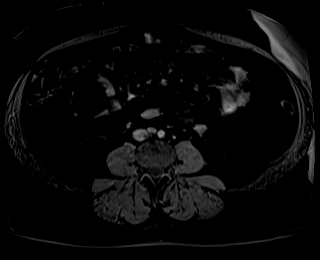
[im 48/96]
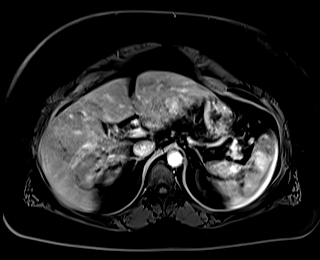
[im 96/96]
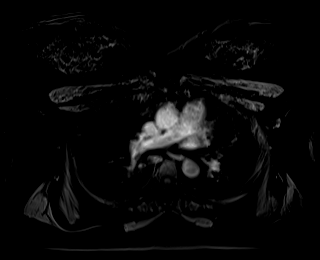

[Series 22: T1 dynamic · axial · 3.0mm · 1.25mm/px · z∈[-144,+141]mm · 3 of 96 slices shown (3 of 9)]
[im 1/96]
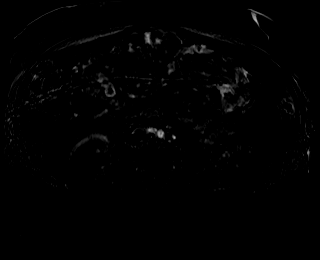
[im 48/96]
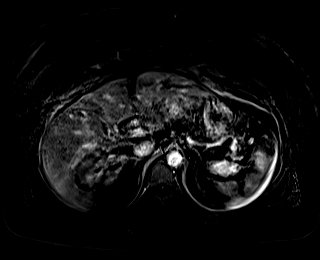
[im 96/96]
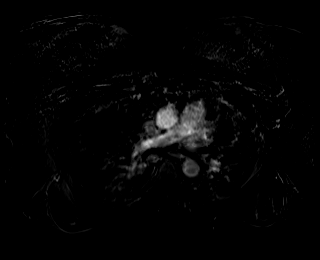

[Series 25: T1 dynamic · axial · 3.0mm · 1.25mm/px · z∈[-144,+141]mm · 3 of 96 slices shown (4 of 9)]
[im 1/96]
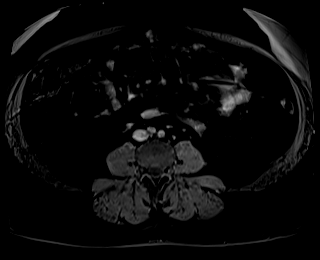
[im 48/96]
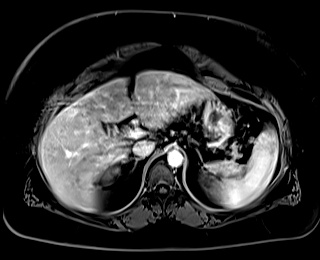
[im 96/96]
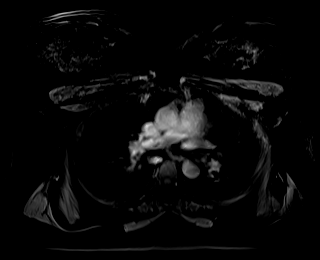

[Series 26: T1 dynamic · axial · 3.0mm · 1.25mm/px · z∈[-144,+141]mm · 3 of 96 slices shown (5 of 9)]
[im 1/96]
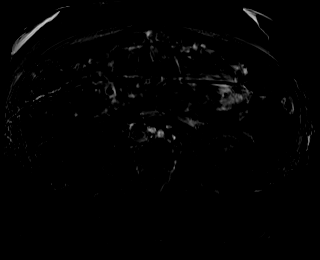
[im 48/96]
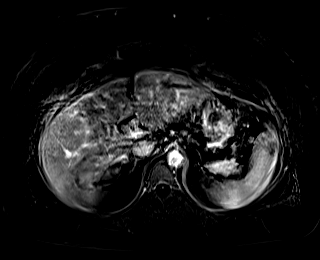
[im 96/96]
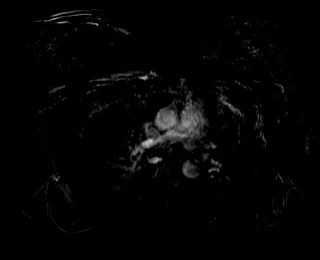

[Series 29: T1 dynamic · axial · 3.0mm · 1.25mm/px · z∈[-144,+141]mm · 3 of 96 slices shown (6 of 9)]
[im 1/96]
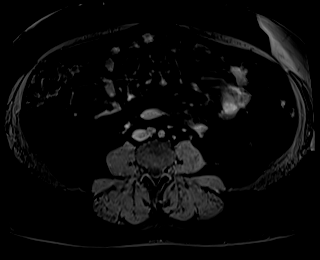
[im 48/96]
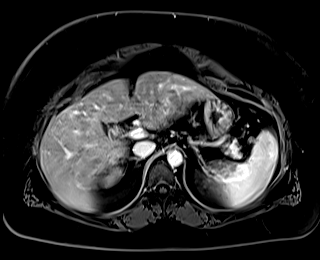
[im 96/96]
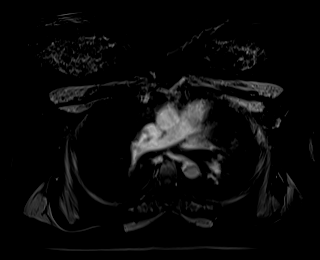

[Series 30: T1 dynamic · axial · 3.0mm · 1.25mm/px · z∈[-144,+141]mm · 3 of 96 slices shown (7 of 9)]
[im 1/96]
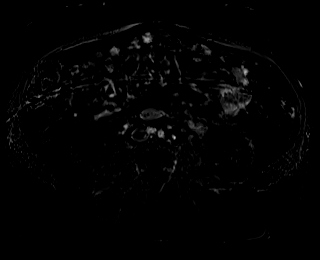
[im 48/96]
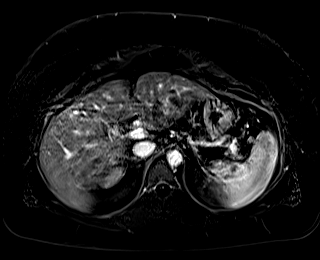
[im 96/96]
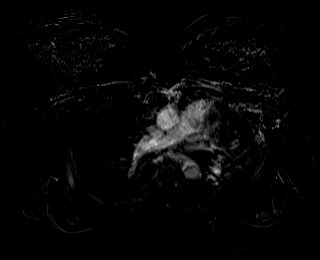

[Series 32: T1 dynamic · coronal · 5.0mm · 1.43mm/px · 2 of 56 slices shown (8 of 9)]
[im 1/56]
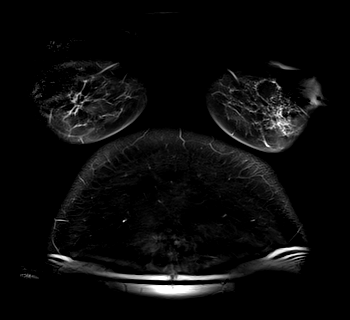
[im 56/56]
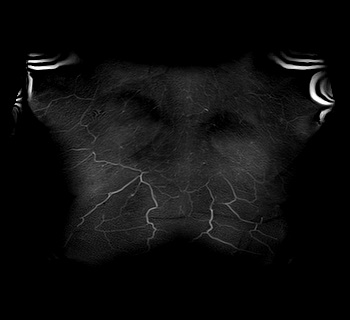

[Series 35: T1 dynamic · axial · 3.0mm · 1.25mm/px · z∈[-144,-3]mm · 2 of 96 slices shown (9 of 9)]
[im 1/96]
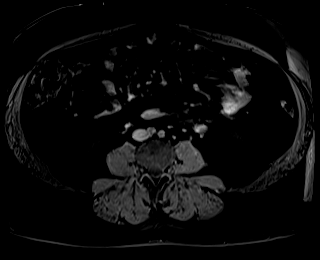
[im 48/96]
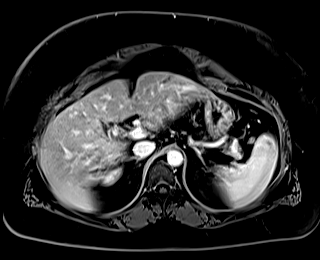

[42 of 48 positions shown; findings below may reference images not displayed]

FINDINGS: Despite efforts by the technologist and patient, mild motion
artifact is present on today's exam and could not be eliminated.
This reduces exam sensitivity and specificity.

Lower chest: Trace pleural effusions and mild atelectasis at both
lung bases.

Hepatobiliary: There is loss of signal in the liver on the gradient
echo opposed phase images consistent with steatosis. No focal lesion
or abnormal enhancement identified. As seen on ultrasound, there are
multiple gallstones. No evidence of gallbladder wall thickening or
surrounding inflammation. No significant residual intrahepatic or
extrahepatic biliary dilatation. The common hepatic duct measures 6
mm in diameter (9 mm on ultrasound). Equivocal findings for a 2-3 mm
stone in the distal common bile duct (best seen on image [DATE] and
[DATE]).

Pancreas: Unremarkable. No pancreatic ductal dilatation or
surrounding inflammatory changes.

Spleen: Normal in size without focal abnormality.

Adrenals/Urinary Tract: Both adrenal glands appear normal. Both
kidneys appear normal without evidence of hydronephrosis or mass
lesion.

Stomach/Bowel: The stomach appears unremarkable for its degree of
distension. No evidence of bowel wall thickening, distention or
surrounding inflammatory change.

Vascular/Lymphatic: There are no enlarged abdominal lymph nodes. No
significant vascular findings.

Other: No evidence of abdominal wall hernia or ascites.

Musculoskeletal: No acute or significant osseous findings.
IMPRESSION: 1. Cholelithiasis without evidence of gallbladder wall thickening.
2. Extrahepatic biliary dilatation has improved compared with recent
ultrasound. This suggests possible interval passage of a common duct
stone. There is a possible 2-3 mm residual stone in the distal
common bile duct.
3. No evidence of pancreatitis.
4. Hepatic steatosis.
